# Patient Record
Sex: Female | Born: 1965 | Race: White | Hispanic: No | Marital: Married | Smoking: Current every day smoker
Health system: Southern US, Community
[De-identification: ages and names within clinical notes are randomized; demographics above are authoritative.]

## PROBLEM LIST (undated history)

## (undated) DIAGNOSIS — M4800 Spinal stenosis, site unspecified: Secondary | ICD-10-CM

## (undated) DIAGNOSIS — M1990 Unspecified osteoarthritis, unspecified site: Secondary | ICD-10-CM

## (undated) DIAGNOSIS — M069 Rheumatoid arthritis, unspecified: Secondary | ICD-10-CM

## (undated) DIAGNOSIS — R011 Cardiac murmur, unspecified: Secondary | ICD-10-CM

## (undated) DIAGNOSIS — M5030 Other cervical disc degeneration, unspecified cervical region: Secondary | ICD-10-CM

## (undated) HISTORY — PX: ABDOMINAL SURGERY: SHX537

## (undated) HISTORY — PX: BACK SURGERY: SHX140

## (undated) HISTORY — PX: TONSILLECTOMY: SUR1361

## (undated) HISTORY — PX: LAPAROSCOPIC SALPINGO OOPHERECTOMY: SHX5927

## (undated) HISTORY — DX: Other cervical disc degeneration, unspecified cervical region: M50.30

## (undated) HISTORY — DX: Spinal stenosis, site unspecified: M48.00

---

## 1999-07-02 ENCOUNTER — Encounter: Payer: Self-pay | Admitting: Neurosurgery

## 1999-07-04 ENCOUNTER — Observation Stay (HOSPITAL_COMMUNITY): Admission: RE | Admit: 1999-07-04 | Discharge: 1999-07-05 | Payer: Self-pay | Admitting: Neurosurgery

## 1999-07-04 ENCOUNTER — Encounter: Payer: Self-pay | Admitting: Neurosurgery

## 1999-10-31 ENCOUNTER — Encounter: Admission: RE | Admit: 1999-10-31 | Discharge: 2000-01-29 | Payer: Self-pay | Admitting: Anesthesiology

## 2000-01-02 ENCOUNTER — Encounter: Payer: Self-pay | Admitting: Physical Medicine and Rehabilitation

## 2000-01-02 ENCOUNTER — Ambulatory Visit (HOSPITAL_COMMUNITY)
Admission: RE | Admit: 2000-01-02 | Discharge: 2000-01-02 | Payer: Self-pay | Admitting: Physical Medicine and Rehabilitation

## 2001-08-17 ENCOUNTER — Encounter: Payer: Self-pay | Admitting: Neurosurgery

## 2001-08-17 ENCOUNTER — Ambulatory Visit (HOSPITAL_COMMUNITY): Admission: RE | Admit: 2001-08-17 | Discharge: 2001-08-17 | Payer: Self-pay | Admitting: Neurosurgery

## 2001-09-15 ENCOUNTER — Encounter: Payer: Self-pay | Admitting: Neurosurgery

## 2001-09-19 ENCOUNTER — Inpatient Hospital Stay (HOSPITAL_COMMUNITY): Admission: RE | Admit: 2001-09-19 | Discharge: 2001-09-20 | Payer: Self-pay | Admitting: Neurosurgery

## 2001-09-19 ENCOUNTER — Encounter: Payer: Self-pay | Admitting: Neurosurgery

## 2001-10-18 ENCOUNTER — Encounter: Admission: RE | Admit: 2001-10-18 | Discharge: 2001-10-18 | Payer: Self-pay | Admitting: Neurosurgery

## 2001-10-18 ENCOUNTER — Encounter: Payer: Self-pay | Admitting: Neurosurgery

## 2001-12-20 ENCOUNTER — Encounter: Admission: RE | Admit: 2001-12-20 | Discharge: 2001-12-20 | Payer: Self-pay | Admitting: Neurosurgery

## 2001-12-20 ENCOUNTER — Encounter: Payer: Self-pay | Admitting: Neurosurgery

## 2002-01-23 ENCOUNTER — Encounter: Admission: RE | Admit: 2002-01-23 | Discharge: 2002-01-23 | Payer: Self-pay | Admitting: Neurosurgery

## 2002-01-23 ENCOUNTER — Encounter: Payer: Self-pay | Admitting: Neurosurgery

## 2002-06-26 ENCOUNTER — Inpatient Hospital Stay (HOSPITAL_COMMUNITY): Admission: AD | Admit: 2002-06-26 | Discharge: 2002-06-28 | Payer: Self-pay | Admitting: Family Medicine

## 2002-06-26 ENCOUNTER — Encounter: Payer: Self-pay | Admitting: Family Medicine

## 2002-06-26 ENCOUNTER — Encounter: Payer: Self-pay | Admitting: Cardiovascular Disease

## 2002-11-19 ENCOUNTER — Emergency Department (HOSPITAL_COMMUNITY): Admission: EM | Admit: 2002-11-19 | Discharge: 2002-11-19 | Payer: Self-pay | Admitting: *Deleted

## 2002-11-20 ENCOUNTER — Encounter: Payer: Self-pay | Admitting: *Deleted

## 2002-11-21 ENCOUNTER — Inpatient Hospital Stay (HOSPITAL_COMMUNITY): Admission: RE | Admit: 2002-11-21 | Discharge: 2002-11-23 | Payer: Self-pay | Admitting: *Deleted

## 2003-09-07 ENCOUNTER — Ambulatory Visit (HOSPITAL_COMMUNITY): Admission: RE | Admit: 2003-09-07 | Discharge: 2003-09-07 | Payer: Self-pay | Admitting: Internal Medicine

## 2004-01-10 ENCOUNTER — Ambulatory Visit (HOSPITAL_COMMUNITY): Admission: RE | Admit: 2004-01-10 | Discharge: 2004-01-10 | Payer: Self-pay | Admitting: Internal Medicine

## 2004-04-30 ENCOUNTER — Ambulatory Visit (HOSPITAL_COMMUNITY): Admission: RE | Admit: 2004-04-30 | Discharge: 2004-04-30 | Payer: Self-pay | Admitting: Internal Medicine

## 2004-05-02 ENCOUNTER — Ambulatory Visit (HOSPITAL_COMMUNITY): Admission: RE | Admit: 2004-05-02 | Discharge: 2004-05-02 | Payer: Self-pay | Admitting: Internal Medicine

## 2004-05-30 ENCOUNTER — Ambulatory Visit: Payer: Self-pay | Admitting: Family Medicine

## 2004-06-18 ENCOUNTER — Ambulatory Visit: Payer: Self-pay | Admitting: Pulmonary Disease

## 2008-10-24 ENCOUNTER — Emergency Department (HOSPITAL_COMMUNITY): Admission: EM | Admit: 2008-10-24 | Discharge: 2008-10-24 | Payer: Self-pay | Admitting: Emergency Medicine

## 2010-07-13 LAB — PREGNANCY, URINE: Preg Test, Ur: NEGATIVE

## 2010-07-13 LAB — URINALYSIS, ROUTINE W REFLEX MICROSCOPIC
Bilirubin Urine: NEGATIVE
Ketones, ur: NEGATIVE mg/dL
Nitrite: POSITIVE — AB
Protein, ur: 30 mg/dL — AB
Urobilinogen, UA: 2 mg/dL — ABNORMAL HIGH (ref 0.0–1.0)

## 2010-07-13 LAB — URINE MICROSCOPIC-ADD ON

## 2010-08-22 NOTE — Discharge Summary (Signed)
Deborah Hoffman, Deborah Hoffman                         ACCOUNT NO.:  [id number]   MEDICAL RECORD NO.:  [id number]                   PATIENT TYPE:  INP   LOCATION:  A416                                 FACILITY:  [facility]   PHYSICIAN:  Langley Gauss, M.D.                DATE OF BIRTH:  March 19, 1966   DATE OF ADMISSION:  11/20/2002  DATE OF DISCHARGE:  11/23/2002                                 DISCHARGE SUMMARY   The patient was initially seen in the ER as a GYN unassigned patient.  This  was on November 20, 2002.  The patient at that point in time was made  observation status following performance of the CT scan and ultrasound.  Subsequently, on November 21, 2002, the patient was admitted, and she was  taken to the operating room where laparoscopy with lysis of adhesions  bilaterally as well as right salpingo-oophorectomy were performed without  complications.  The patient subsequently required hospital care August 17,  November 22, 2002, and was discharged home on November 23, 2002.   DISCHARGE MEDICATIONS:  Tylox #30, with no refill.   LABORATORY STUDIES:  A positive blood type.  BUN and creatinine within  normal limits.  Initial laboratories:  Hemoglobin 15.5, hematocrit 45.2,  with a white count of 6.1, subsequent 13.6/40.2, and the p.m. of November 21, 2002, hemoglobin had equilibrated to 10.3.   Other laboratory studies:  A CT scan had been performed as appendicitis was  a consideration.  The CT scan showed an enlarged complex-appearing right  ovary with free fluid seen within the pelvis.  The overall size of the ovary  was 5.6 x 4.6 cm.  The enlarged ovary was described as having cystic areas,  thickened irregular walls, and surrounding infiltrative and inflammatory  changes.  The appendix was not visualized, but there were no findings which  would be suggestive of appendicitis.  Subsequently, for better visualization  of the ovary, a transvaginal ultrasound had been performed which again  revealed an irregular enlarged right ovary with an irregular hypoechoic area  in the right ovary measuring 2.2 cm in greatest diameter.  Impression was  probable hemorrhagic ovarian cyst which could have been managed expectantly.  However, the patient was having severe pain at that time, so there was  always a consideration of torsion occurring.   Thus, the patient was taken to the operating room.  The laparoscopy and  right salpingo-oophorectomy were performed without complications.  The  patient had had a previous tubal ligation.  Postoperatively the patient did  well.  She was treated with PCA Dilaudid.  She did well with this.  Foley  catheter was removed on postoperative day #1.  Thereafter she was also  changed to oral medications to include oral pain medications.  She remained  afebrile, advanced to a regular general diet such that she was ready for  discharge on November 23, 2002.  On examination the abdomen is noted to be  soft and nontender.  The incision site is noted to be well approximated, and  it seems to be healing well with minimal erythema and minimal induration.  Thus, ready for discharge on November 23, 2002.                                               Langley Gauss, M.D.    DC/MEDQ  D:  11/30/2002  T:  11/30/2002  Job:  [id number]

## 2010-08-22 NOTE — Discharge Summary (Signed)
   Deborah Hoffman, Deborah Hoffman                         ACCOUNT NO.:  [id number]   MEDICAL RECORD NO.:  [id number]                   PATIENT TYPE:  INP   LOCATION:  A223                                 FACILITY:  [facility]   PHYSICIAN:  Kirk Ruths, M.D.            DATE OF BIRTH:  Oct 06, 1965   DATE OF ADMISSION:  06/26/2002  DATE OF DISCHARGE:  06/28/2002                                 DISCHARGE SUMMARY   DISCHARGE DIAGNOSES:  1. Chest pain.  2. Chronic back pain.  3. Tobacco use.   HOSPITAL COURSE:  This 45 year old white female was admitted to the office  complaining of chest pain which is described as a cinder block sitting on  her chest.  The pain had recent occurred two days before, relieved with some  rest and Valium with recurrence on the day of admission.  She also had  tingling in her right arm, nausea, sweats, dizziness and palpitations.  Due  to the patient's father dying at 51 of a myocardial infarctions and her  symptoms, she is admitted for rule out MI.  The patient was admitted and  monitored on  telemetry.  She was seen in consultation by [facility].  The patient's chest x-ray was normal.  Serial cardiac enzymes  were normal.  Blood gases were 7.42, PCO2 42 and PO2 of 73 which was  somewhat low for somewhat of her age, although, she does smoke.  White count  was 5900 with hemoglobin of 14.6.  D. dimer was slightly up at 0.51 with  0.48 being upper limits of normal.  Electrolytes were normal except for  potassium of 3.4.  Urinalysis was unremarkable.  RPR was nonreactive.  The  patient also underwent echocardiogram which was perfectly normal.  She  underwent a CT scan of her chest which ruled out pulmonary embolus.  There  was some mild emphysema and reactive airways consistent with bronchitis.  The patient's blood pressure has been running low since she was admitted,  although, she was on a nitroglycerin drip initially.  The patient's EKG was  normal.   ASSESSMENT:  Chest pain resolved, myocardial infarction ruled out.   PLAN:  Will discharge the patient home on previous medications which are  basically muscle relaxants and pain medications for her back.  She will  start taking one enteric coated baby aspirin daily.  Will be followed by  [facility] at a later date for stress Cardiolite.  Stable at  the time of discharge.                                               Kirk Ruths, M.D.    WMM/MEDQ  D:  06/28/2002  T:  06/29/2002  Job:  [id number]

## 2010-08-22 NOTE — Op Note (Signed)
[place]  Patient:    Deborah Hoffman, Deborah Hoffman Visit Number: [id number] MRN: [id number]          Service Type: SUR Location: [facility] 3172 02 Attending Physician:  Donn Pierini Dictated by:   Julio Sicks, M.D. Proc. Date: 09/19/01 Admit Date:  09/19/2001                             Operative Report  PREOPERATIVE DIAGNOSIS:  L4-5 degenerative disk disease with intractable back pain/failed back syndrome.  POSTOPERATIVE DIAGNOSIS:  L4-5 degenerative disk disease with intractable back pain/failed back syndrome.  OPERATION PERFORMED:  Re-exploration of L4-5 laminotomy with complete L4-5 decompressive laminectomy and bilateral L4-5 microdiskectomies.  L4-5 posterior lumbar interbody fusion utilizing tangent wedges and local autograft.  L4-5 posterolateral fusion utilizing pedicle screw instrumentation and local autograft.  SURGEON:  Julio Sicks, M.D.  ASSISTANT:  Reinaldo Meeker, M.D.  ANESTHESIA:  General endotracheal.  INDICATIONS FOR PROCEDURE:  The patient is a 45 year old female who is status post previous right-sided L4-5 laminotomy and microdiskectomy.  The patient had persistent pain postoperatively.  She underwent re-exploration for her laminotomy by another physician at an outside hospital without improvement of her symptoms.  The patient presents now with continued severe back and right lower extremity pain failing all conservative management.  MRI scan demonstrates severe degenerative disk disease at the L4-5 level with evidence of type 2 Modic changes within the vertebral end plates.  The remainder of the lumbar spine although showing some mild degeneration does not show any significant structural issues.  We discussed options available for management including the possibility of undergoing an L4-5 decompression and fusion surgery in hopes of alleviating some of her symptoms.  The patient is aware of the risks and benefits and wished to  proceed.  DESCRIPTION OF PROCEDURE:  The patient was taken to the operating room and placed on the operating table in supine position.  After an adequate level of anesthesia was achieved, the patient was positioned prone onto a Wilson frame and appropriately padded.  The patients lumbar region was shaved and prepped sterilely.  A 10 blade was used to make a linear skin incision overlying the L3, L4 and L5 levels.  This was carried down sharply in the midline. Subperiosteal dissection was performed on the left side exposing the lamina and facet joints at L4, L3 and L5 as well as the transverse processes of L4 and L5.  The deep self-retaining retractor was placed.  Intraoperative fluoroscopy was used and the levels were confirmed.  The patients previous laminotomy on the right side at L4-5 was dissected free using dental instruments and the electrocautery.   A complete laminectomy at L4 was then performed using Kerrison rongeurs, Leksell rongeurs, and a high speed drill. All elements of the lamina were resected.  A complete inferior facetectomy at L4 was then performed bilaterally.  A superior facetectomy of L5 was then performed bilaterally.  All bone was cleaned and used for later autograft. The ligamentum flavum was then elevated and resected in piecemeal fashion using Kerrison rongeurs.  Epidural scar was dissected free using dental instruments.  A very small partial thickness dural laceration was encountered on the right side.  This was oversewn with 5-0 Prolene in a figure-of-eight fashion.  The epidural venous plexus was coagulated and cut.  Starting first, the patients left side thecal sac and nerve roots were protected.  Disk space isolated, incised with  a 15 blade in rectangular fashion.  A wide disk space clean-out was then achieved using pituitary rongeurs, upward angled pituitary rongeurs and Epstein curets.  The procedure was then repeated on the patients right side.  At this  time epidural scar was dissected free and the thecal sac and L5 nerve root were mobilized and retracted toward the midline.  The disk space was incised with the 15 blade in rectangular fashion.  A wide disk space clean-out was then achieved on the right side.  The disk space was then sequentially dilated up to 10 mm.  With a 10 mm distractor left in the patients left side and the nerve roots protected on the patients right side, the disk space was then reamed and then cut with a 10 mm tangent chisel.  All soft tissue was  removed from the interspace.  A 10 x 24 mm tangent wedge was then impacted into placed and recessed approximately 2 mm from the posterior cortical surface.  The distractor was removed from the contralateral side. The nerve root and thecal sac were then protected on the the patients left side.  Once again, the disk space was then reamed and then cut with a 10 mm chisel.  Prior to installation of the second wedge, morselized autograft was packed in the interspace.  A second 10 x 24 mm tangent wedge was then impacted into place and recessed approximately from the posterior cortical margin.  The pedicles at L4 and L5 were then isolated using surface landmarks and intraoperative fluoroscopy.  An entry point was then made using a high speed drill.  The pedicles were then probed using a pedicle awl.  Each pedicle awl tract was found to be solidly within bone by using a blunt probe.  Each pedicle awl tract was then tapped with a 5.25 mm screw tap.  Each screw tap hole was found to be solidly within bone.  At L4 and L5 bilaterally 6.75 x 40 mm spiral 90 screws were then placed bilaterally.  These were given a tightening and found to be solidly within bone.  The transverse processes at L4 and L5 were then decorticated bilaterally using electrocautery.  Morselized autograft was packed posterolaterally.  A short segment of titanium rod was then contoured and placed over the screw heads  at L4 and L5.  Locking caps were then placed on the screw heads and then these were secured in a sequential fashion while placing the construct under compression.  Blunt probe  was passed easily along the exiting L4 and L5 nerve roots bilaterally.  There is no evidence of injury to the nerve roots bilaterally.  There is no evidence of CSF leak.  After the wound was irrigated one final time, Tisseal fibrin glue sealant was placed over the dural repair on the patients right side. Gelfoam was placed over the laminectomy defect for hemostasis which was found to be good.  The wound was then closed in layers with Vicryl sutures. Steri-Strips and sterile dressing were applied.  There were no apparent complications.  The patient tolerated the procedure well and she returned to the recovery room postoperatively. Dictated by:   Julio Sicks, M.D. Attending Physician:  Donn Pierini DD:  09/19/01 TD:  09/19/01 Job: [id number]

## 2010-08-22 NOTE — Procedures (Signed)
[facility]  Patient:    Deborah Hoffman, Deborah Hoffman                        MRN: [id number] Proc. Date: 10/31/99 Adm. Date:  [id number] Attending:  Thyra Breed CC:         Lillia Dallas. Murray Hodgkins, M.D.                           Procedure Report  PROCEDURE:  Lumbar epidural steroid injection.  DIAGNOSIS:  Lumbar radiculopathy with MRI showing epidural scarring at L4-5 to the right. She also has some degenerative disk disease at L4-5 and L3-4.  HISTORY OF PRESENT ILLNESS:  The patient gives a history of a work related injury on June 11, 1999 which necessitated surgical intervention by Dr. Coletta Memos on March 30. Postoperative pain levels did not go down significantly. She now has a hot spot in her right buttock and right hip region which she described as a continuous throb. She feels as though her entire right lower extremity is on fire. She notes that she has numbness through the entire extremity and has a tendency to drag her foot. She denied any loss of control of her bowels or bladder but has noted some decreased emptying of her bladder. This is more correlated with the opiates she has been taking. She notes that her back is improved by laying flat with 2 pillows under her leg but she only gets about 2 hours per night. She has been recently started on Pamelor for this. She is also taking Vicodin and notes that she gets some improvement with 2 tablets q. 4h but apparently is taking this far in excess of what it was prescribed at. She says that her pain is made worse by bending, squatting, or lifting.  CURRENT MEDICATIONS:  Vicodin and Pamelor.  ALLERGIES:  PENICILLIN and ANTIHISTAMINES as well as AMBIEN.  FAMILY HISTORY:  Positive for coronary artery disease, COPD and osteoarthritis.  PAST SURGICAL HISTORY:  Significant for the back surgery on July 04, 1999, revision of intra-abdominal scars at the age of 69, and tonsillectomy as a child.  SOCIAL HISTORY:  The  patient is a pack per day smoker. She does not drink alcohol. 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  ACTIVE MEDICAL PROBLEMS:  History of chronic bronchitis, history of heart murmur, and fractures to her left ankle x 2 as well as remote history of depression 12 years ago which required hospitalization.  REVIEW OF SYSTEMS:  GENERAL:  The patient does get night sweats. EYES: Negative. NOSE/MOUTH/THROAT:  Negative. HEAD:  Negative.  EARS: Negative. PULMONARY: Negative except for chronic bronchitis and cigarette smoking. HEART: Significant murmur. GU:  Negative although she does have difficulty voiding which she mentioned previously. GI:  Negative. MUSCULOSKELETAL/NEUROLOGIC:  See HPI. HEMATOLOGIC:  Negative. ENDOCRINE: Negative. CUTANEOUS:  Negative. PSYCHIATRIC:  History of depression. ALLERGY/IMMUNOLOGIC:  Negative.  PHYSICAL EXAMINATION:  VITAL SIGNS:  Blood pressure 112/65, heart rate 97, respiratory rate 16, O2 saturations 99%, pain level 8/10 and temperature is 98.6.  GENERAL:  This is a pleasant, anxious female in no acute distress.  HEENT:  Head was normocephalic, atraumatic. Eyes, extraocular movements intact. Conjunctivae and sclerae clear. Nose patent nares without discharge. Oropharynx was free of lesions.  NECK:  Supple without lymphadenopathy.  LUNGS:  Clear.  HEART:  Regular rate and rhythm with grade 1/6 systolic ejection murmur along the  left sternal border.  BREASTS/ABDOMINAL/PELVIC/RECTAL:  Not performed.  BACK:  Revealed well healed surgical scar with tenderness over the scar. Straight leg raise were positive on the right side at 60 degrees.  EXTREMITIES:  No cyanosis, clubbing or edema. Radial pulses and dorsalis pedis pulse 2+ and symmetric.  NEUROLOGIC:  The patient was oriented x 4. Cranial nerves II-XII are grossly intact. Deep tendon reflexes were 0-1+ in the upper extremities and lower extremities and symmetric with  downgoing toes. Motor was 5/5 with symmetric bulk and tone. Sensory demonstrated global attenuated sensation to pinprick vibratory sense in the right lower extremity in a nondermatomal distribution.  MRI of her back from Aug 12, 1999 demonstrated right central disk protrusion at 4-5 with scar tissue and mild disk space narrowing at 4-5 and 3-4. There was some scarring about the right L5 nerve root.  IMPRESSION: 1. Low back pain with radiation out into the right lower extremity but in a a    nondermatomal distribution with MRI demonstrating degenerative disk    disease, and scarring around the L5 nerve root. 2. Chronic bronchitis by history. 3. Heart murmur. 4. History of fractures to her left ankle. 5. Remote history of depression.  DISPOSITION:  I discussed the potential risks, benefits and limitations of a lumbar epidural steroid injection in detail with the patient. She is interested in pursuing this.  DESCRIPTION OF PROCEDURE:  After informed consent was obtained, the patient was placed in the sitting position and monitored. The patients back was prepped with Betadine x 3. A skin wheal was raised at the L3-4 interspace with 1 percent lidocaine. A 20 gauge Tuohy needle was introduced to the lumbar epidural space to loss of resistance to preservative free normal saline. There was no cerebrospinal fluid nor blood. 80 mg of Medrol and 8 ml of preservative free normal saline was gently injected. The needle was flushed with preservative free normal saline and removed intact.  CONDITION POST PROCEDURE:  Stable.  DISCHARGE INSTRUCTIONS:  Resume previous diet. Limitations in activities per instruction sheet. Resume previous medications. The patient plans the see Dr. Murray Hodgkins back in follow-up next week. DD:  10/31/99 TD:  11/03/99 Job: [id number]

## 2010-08-22 NOTE — H&P (Signed)
. [facility]  Patient:    Deborah Hoffman, Deborah Hoffman                        MRN: [id number] Adm. Date:  [id number] Attending:  Coletta Memos                         History and Physical  ADMITTING DIAGNOSES:  1. Displaced disk, L4-5, right.  2. Degenerative disk disease, L4-5.  HISTORY OF PRESENT ILLNESS: Deborah Hoffman is a 45 year old woman who initially presented to me on January 30, 1999.  At that time she was having back and bilateral lower extremity pain.  She works at [employer] where she does have to do a lot of lifting.  On June 11, 1999 she lifted a box about 80 pounds in weight and then had a searing pain going into the right lower extremity.  The pain was  similar to what she had in the past but much more severe.  An MRI which was done on June 23, 1999 showed changes in the lumbar spine and she is coming back for review.  REVIEW OF SYSTEMS: Positive for sinus problems, heart murmur, leg pain with walking, bronchitis.  She has a history of a fractured left ankle, collar bone, and tail bone.  She reports arm weakness, leg weakness, back pain, leg pain, joint pain, arthritis, neck pain, breast pain, problems with coordination.  She denies constitutional, eye, ear, nose, throat, mouth, gastrointestinal or genitourinary, psychiatric, endocrine, hematologic, or allergic problems.  She has pain going p between her shoulder blades.  PAST SURGICAL HISTORY:  1. Tonsillectomy in 1970.  2. Female surgery in 1980 by her account.  ALLERGIES: PENICILLIN, HYDROBROMIDE OR HYDROCHLORIDE, and NYQUIL makes her hallucinate.  SOCIAL HISTORY: She smokes a pack of cigarettes a day since 1978.  She drinks alcohol infrequently.  She has no history of illicit drug use.  FAMILY HISTORY: Mother age 49 is in fair health.  Father died at age 48 and had a myocardial infarction.  Diabetes and hypertension are present in the family.  PHYSICAL EXAMINATION:  VITAL  SIGNS:  Height 5 feet 1-1/2 inches.  Weight 145 pounds.  NEUROLOGIC: She has 5/5 strength in the lower extremities.  Positive straight leg raising on the right side.  Muscle tone, bulk, and coordination are normal. She has decreased pinprick throughout the lower extremities.  Downgoing toes on plantar stimulation.  There is no clonus.  Cranial nerves 2-12 normal.  NECK: No cervical masses or bruits.  HEART: No heart murmur on examination.  CHEST:  Lung fields are clear.  LABORATORY DATA: MRI done June 23, 1999 shows a herniated disk at L4-5 compressing the L5 nerve root on the right side.  Disk is degenerated.  DIAGNOSES:  1. Displaced disk, L4-5, right.  2. Degenerative disk disease, L405.  PLAN:  Ms. Deborah Hoffman is having a significant amount of pain in the right lower extremity.  We discussed steroid therapy and she feels that she just wants the ain taken care of as fast as possible.  For that reason we will simply go ahead and  schedule surgery, which will be laminectomy and diskectomy at L4-5 on the right  side.  The risks of the procedure including bleeding, infection, no pain relief, bowel or bladder dysfunction, recurrent disk herniation, need for further surgery were discussed.  She understands and wishes to proceed.DD:  07/04/99 TD:  07/04/99 Job: [id number]  [id number]

## 2010-08-22 NOTE — H&P (Signed)
Deborah Hoffman, Deborah Hoffman                         ACCOUNT NO.:  [id number]   MEDICAL RECORD NO.:  [id number]                   PATIENT TYPE:  OBV   LOCATION:  A416                                 FACILITY:  [facility]   PHYSICIAN:  Langley Gauss, M.D.                DATE OF BIRTH:  02/13/1966   DATE OF ADMISSION:  11/20/2002  DATE OF DISCHARGE:                                HISTORY & PHYSICAL   HISTORY OF PRESENT ILLNESS:  The patient is a 45 year old multigravida with  a last menstrual period about three weeks previously who presents to the  emergency room on a.m. of November 20, 2002, for which I am contacted for  complete management as an unassigned patient.  Interestingly enough, the  patient had been seen in the emergency room on November 19, 2002, with the  same exact pain which is unchanged.  On November 19, 2002, with this right  lower quadrant pain, she was diagnosed with bacteriuria and bacterial  vaginosis.  She was discharged from the emergency room with instructions to  follow up the a.m. of November 20, 2002, for a transvaginal ultrasound.  The  transvaginal ultrasound was performed in the Department of Radiology on  November 20, 2002, at which time, due to the pain she was experiencing, she  was sent back to the emergency room by the radiology staff.  This is now  Monday.  Unassigned call roster house now changed, and for that reason, I am  contacted for complete management, even though the patient initially  presented on November 19, 2002, with the same complaints.  The patient's  primary complaint is that of a several week history of diffuse abdominal  pain with now some localization to the right lower quadrant over the past  five days.  She also describes a poor appetite.  She does have some hot and  cold sweats, although she does not have a thermometer to take her  temperature.  She states the pain was most severe about 24 hours previously,  at which time the right lower quadrant  pain became acutely severe and sharp  in nature which prompted her visitation to the emergency room on November 19, 2002, at which time absolutely nothing was resolved.  Rather, the case was  just held over to be managed on November 20, 2002.  The patient's last  menstrual period was three weeks previously.   REVIEW OF SYSTEMS:  Pertinent for no prior GI complaints.  No prior history  of IBS, diarrhea, constipation or colitis.  Menstrual periods are regular  and predictable.  No significant dysmenorrhea.  No significant clots.   ALLERGIES:  PENICILLIN, AMBIEN, NEURONTIN, and ANTIHISTAMINES.   MEDICATIONS:  1. OxyContin 40 mg p.o. b.i.d..  2. Vicodin 7.5/650 p.r.n.  3. Valium 5 mg p.o. t.i.d.   PAST MEDICAL HISTORY:  Pertinent for three prior back surgeries with  resultant chronic back  pain at present which is the indication for both her  disability as well as her chronic use of OxyContin.  Past history is  otherwise pertinent only for vaginal deliveries.   PHYSICAL EXAMINATION:  GENERAL APPEARANCE:  The patient is obviously in  moderate distress, although this is completely unchanged from the patient's  presentation on November 19, 2002, per patient statement.  She does state that  when she left the emergency room in the p.m. of November 19, 2002, she was  hardly able to walk due to the pain.  VITAL SIGNS:  Temperature 97.7, pulse 86, respiratory rate 24, blood  pressure 121/75.  HEENT:  Negative.  No adenopathy.  NECK:  Supple.  Thyroid is nonpalpable.  LUNGS:  Clear.  CARDIOVASCULAR:  Regular rate and rhythm.  ABDOMEN:  Soft, nontender with the exception of right lower quadrant pain  particularly in the right adnexal region as well as midway between the  umbilicus and suprapubic area.  No guarding or rebound is identified, but  moderate tenderness is elicited with deep palpation.  The abdomen itself is  nondistended.  Bowel sounds in all quadrants.  No pelvic or abdominal masses  are  appreciated.  EXTREMITIES:  Noted to be normal.  GU:  External genitalia within normal limits.  PELVIC:  Not performed.  This has been performed by Dr. Rosalia Hammers in the emergency  room at which time she states she also did GC/Chlamydia cultures.   PERTINENT LABORATORY DATA:  Negative beta HCG.  Hemoglobin 15.4, hematocrit  44.8 with a white count of 9.4.  Urinalysis is completely negative.  Chem-12  is within normal limits.  Electrolytes within normal limits.  Liver function  tests within normal limits.  Urinalysis is pertinent only for a hemorrhagic  cyst.  Prominent uterine size identified.  There is not an enlarged right  ovary but what appears to be a hemorrhagic cyst.   Due to the patient's symptoms, after I evaluated the patient myself on  November 20, 2002, the History and Physical findings were suggestive of  possible appendicitis; thus, CT scan with contrast was ordered by myself.  Results reveal the appendix is not visualized.  The right ovary is noted to  be more significant than on ultrasound with a 5.6 x 4.6 cm complex cyst with  thick irregular wall and surrounding inflammatory versus infiltrative  changes.  Impression of the radiologist was probable hemorrhagic ovarian  cyst with right ovarian torsion a possibility.   The patient is evaluated throughout the day on November 20, 2002.  She is  noted to have no change in her pain.  The pain persists and is managed only  with the IV Dilaudid.   PLAN:  With this significant lesion noted on the right adnexal region by CT  scan as well as the pain the patient is experiencing, she will be added on  the schedule on November 21, 2002, for a laparoscopy at which time we will  proceed with management of the adnexal mass which will consist of removal of  right tube and ovary.                                               Langley Gauss, M.D.    DC/MEDQ  D:  11/21/2002  T:  11/21/2002  Job:  [id number]

## 2010-08-22 NOTE — Op Note (Signed)
[facility]  Patient:    Deborah Hoffman, Deborah Hoffman                        MRN: [id number] Proc. Date: 07/04/99 Adm. Date:  [id number] Attending:  Coletta Memos                           Operative Report  ADMISSION DIAGNOSES: 1. Displaced disk at L4-5, right. 2. Degenerative disk disease at L4-5. 3. Right L5 radiculopathy.  POSTOPERATIVE DIAGNOSES: 1. Displaced disk at L4-5, right. 2. Degenerative disk disease at L4-5. 3. Right L5 radiculopathy.  PROCEDURES:  Right L4-5 semi-hemilaminectomy and diskectomy with microdissection.  COMPLICATIONS:  None.  SURGEON:  Kyle L. Franky Macho, M.D.  ASSISTANT:  Payton Doughty, M.D.  INDICATIONS:  Deborah Hoffman is a 45 year old woman with right lower extremity pain, which has not responded to conservative measures and has been unremitting.  She has a displaced disk at L4-5 on the right side.  I have recommended, and she has agreed to undergo a right L4-5 semi-hemilaminectomy and diskectomy.  OPERATIVE NOTE:  Deborah Hoffman was brought to the operating room.  She was intubated and placed under general anesthesia without difficulty.  She was rolled prone into a Wilson frame, and all pressure points were properly padded.  The skin was prepped with DuraPrep.  She was draped in sterile fashion.  I took a preoperative, localizing x-ray and used that as my guide.  I infiltrated my proposed incision  site with 0.5% lidocaine 1:200,000 strength epinephrine sterilely.  I then opened the incision with a #10 blade and took this down to the thoracolumbar fascia sharply.  I controlled bleeding in the subcutaneous tissues with bipolar cautery. I created a fascial flap in a semicircular fashion with a #10 blade and then Metzenbaum scissors.  I retracted that flap medially towards the left side.  I hen exposed the lamina of L4 and L5.  I took another x-ray with the ______ knife placed inferior to the lamina of L4.  X-ray confirmed  my position.  I then proceeded to perform a semi-hemilaminectomy at L4 using a Midas Rex drill and microdissection.  After exposing the thecal sac by removing the ligamentum flavum, I retracted that medially.  There were a significant amount of epidural blood vessels, which I was able to coagulate with bipolar cautery.  I retracted the thecal sac again medially and exposed the disk herniation.  I opened this was a  #15 blade and then proceeded to do a diskectomy with along with Dr. Channing Mutters using pituitary rongeurs, Epstein curettes, straight curettes, and nerve hooks.  I emptied the disk space until I felt that there was no more pressure on the L5 nerve root.  I irrigated the wound, and inspected the nerve root medially, laterally,  ______ , and caudally, and felt no resistance.  I then irrigated again.  I reapproximated the fascia with 5-0 Vicryl sutures.  I reapproximated the subcutaneous tissue with Vicryl sutures.  I reapproximated the skin edges with Steri-Strips.  A sterile dressing was applied.  The patient was rolled off of the Wilson frame, extubated, and moving all extremities postoperatively. DD:  07/04/99 TD:  07/05/99 Job: [id number]

## 2010-08-22 NOTE — Op Note (Signed)
Deborah Hoffman, HARPENAU                         ACCOUNT NO.:  [id number]   MEDICAL RECORD NO.:  [id number]                   PATIENT TYPE:  INP   LOCATION:  A416                                 FACILITY:  [facility]   PHYSICIAN:  Langley Gauss, M.D.                DATE OF BIRTH:  1966-03-07   DATE OF PROCEDURE:  DATE OF DISCHARGE:                                 OPERATIVE REPORT   PREOPERATIVE DIAGNOSES:  1. Pelvic pain.  2. Right complex ovarian cyst 8 cm in diameter.   POSTOPERATIVE DIAGNOSES:  1. Pelvic pain.  2. Right complex ovarian cyst 8 cm in diameter.  3. 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  The uterus itself is noted     to be diffusely enlarged with exterior contour, very irregular consistent     with multiple leiomyomas the largest noted to be estimated at 4 cm in     diameter in the posterior fundal portion of the uterus.   PROCEDURE:  1. Laparoscopy.  2. Lysis of adhesions bilaterally.  3. Right salpingo-oophorectomy.   SURGEON:  Dr. Roylene Reason. Lisette Grinder   ESTIMATED BLOOD LOSS:  Less than 100 cc.   ANESTHESIA:  General endotracheal   DRAINS:  Foley catheter left to straight drainage following the procedure.   OPERATIVE FINDINGS:  The patient is taken to the operating room and vital  signs are stable. The patient underwent uncomplicated induction of general  endotracheal anesthesia.  Pertinently she was positioned in the low  lithotomy position in the awake state, such that it could be very sensitive  to her chronic low back pain and avoid any excessive flexion at the hips.  A  Foley catheter is then placed to straight drainage.  Speculum examination is  performed.  The cervix is visualized.  The anterior lip is grasped with a  single-tooth tenaculum. A Hulka intrauterine manipulator was then passed  through the endocervical os and used to grab the cervix at 12 o'clock.   The patient's  abdominal area had been sterilely prepped.  I then changed to  sterile gloves. Standing at the patient's left side a 1 cm vertical incision  was made just inferior to the umbilicus. In addition a 1 cm suprapubic  horizontal incision was made.   The abdominal wall was then elevated. The Veress needle was then passed  through the subumbilical incision angling towards the hollow of the sacrum.  This was done atraumatically.  A drop test confirms a proper intraperitoneal  placement.  A pneumoperitoneum is then created by insufflating 3 liters of  CO2 gas at a filling pressure of less than 15 mmHg.  The Veress needle was  then removed.  The abdominal wall is again elevated.  The Vis-A-Port  disposable trocar is then utilized with camera connected, such that, under  direct visualization  the peritoneal cavity is atraumatically, sharply  entered without difficulty.  A 5 mm suprapubic trocar is then inserted under  direct visualization.  The pelvic contents are as previously described.   A third, 12-mm trocar is placed to the left of the umbilicus under direct  visualization.  This then allows 2 grasping instruments to be placed through  these incision sites which allows unipolar endoshears to be utilized to  sharply dissect the right complex ovary and distal fallopian tube from the  anterior abdominal wall.  The pedicles are then examined, specifically the  utero-ovarian as well as the infundibulopelvic ligament.  With the ovarian  mass elevated, a total of 2 firings of the 12-mm EndoGIA are then required.  These cross at right ankles.  One clamped across the infundibulopelvic  ligament, the other across the utero-ovarian ligament.  This completely  frees up the mass.   The EndoCatch sac is then passed down through the 12-mm trocar.  The cyst  complex mass is placed within the EndoCatch and removed intact; passed off  for permanent specimen.  The trocar is then reinserted through this left   lateral incision.  Under direct visualization, then the adhesive disease on  the left adnexa is then managed, dissected sharply off the anterior  abdominal wall utilizing sharp dissection with the unipolar endoshears. The  left ovary is noted to be normal in appearance with multiple small cysts  identified.   The 2 sites, then, are completely free of any adhesions. Irrigation is  performed.  A small amount of cautery is required utilizing the J-hook  cautery along our staple lines to assure hemostasis.  Irrigation is then  performed until clear.  A single piece of Surgicel cloth is then placed over  our staple lines in the right adnexa.  Our instruments are then removed.  Gas is allowed to escape.  Our incision sites are closed utilizing, on the  left lateral 12-mm sites, specifically 2-0 Vicryl in a continuous running  fashion to reapproximate the fascia and associated underlying peritoneum.   Excellent hemostasis is achieved.  A single 2-0 Vicryl suture is then placed  in the subcutaneous fat to function as subcutaneous tissue to close dead  space.  Likewise 2-0 Vicryl sutures are placed through the subumbilical  incision to close up the dead space.  The 3 incision sites are then  completely closed utilizing skin staples and 20 cc of 0.5% bupivacaine plain  injected along the skin incisions to facilitate postoperative analgesia.   The procedure is then completed. Dressings are applied. Foley catheter is  left in place and continues to drain clear, yellow urine.  Hulka manipulator  is removed from the cervix.  The patient is then reversed of anesthesia,  taken to the recovery room in stable condition; at which time the operative  findings discussed with the patient's awaiting family.  She will return to  the fourth floor after appropriate recovery period.                                               Langley Gauss, M.D.    DC/MEDQ  D:  11/21/2002  T:  11/22/2002  Job:  [id number]

## 2010-08-22 NOTE — H&P (Signed)
   Deborah Hoffman, Deborah Hoffman                         ACCOUNT NO.:  [id number]   MEDICAL RECORD NO.:  [id number]                   PATIENT TYPE:   LOCATION:  ICU3                                 FACILITY:   PHYSICIAN:  Kirk Ruths, M.D.            DATE OF BIRTH:  Jun 19, 1965   DATE OF ADMISSION:  DATE OF DISCHARGE:                                HISTORY & PHYSICAL   CHIEF COMPLAINT:  Chest pain.   PRESENTING ILLNESS:  This is a 45 year old white female who slipped in the  bathtub two days before this admission and bruised her leg, later that day  began having chest pain which was described as a pressure.  The pain  improved with rest and Valium, today has reoccurred.  She describes the pain  as an 8, states it feels like a cinder block sitting on her chest.  She has  some tingling in her right arm with some nausea, sweatiness, dizziness, and  palpitations.  EKG in the office was essentially normal although there was  some poor R wave progression in the lateral precordial leads.  The patient's  father died of an MI at 98.  She is a smoker and is sedentary due to  disability from her back.  The pain was slightly relieved by some  nitroglycerin in the office.  She will be admitted for follow-up MI.   ALLERGIES:  PENICILLIN.  She also cannot take PAXIL, NEURONTIN, or  DURAGESIC.   CURRENT MEDICATIONS:  Vicodin, Valium, and OxyContin for chronic back pain.   PAST SURGICAL HISTORY:  The patient is status post diskectomy of the low  back as well as abdominal surgery approximately 20 years ago, sounds like  some adhesions relating to her fallopian tubes and colon.   REVIEW OF SYSTEMS:  Noncontributory.   PHYSICAL EXAMINATION:  GENERAL:  A well-developed white female, appears in  no distress.  VITAL SIGNS:  Blood pressure 128/78, pulse 78 and regular, respirations 20  and unlabored.  HEENT:  TMs normal.  Pupils equal to light and accommodation.  Oropharynx  benign.  NECK:  Supple  without JVD, bruit, or thyromegaly.  LUNGS:  Clear in all areas.  HEART:  Regular sinus rhythm without murmur, gallop, or rub.  ABDOMEN:  Soft and nontender.  CHEST:  The chest wall is mildly tender.  ABDOMEN:  Soft and nontender.  EXTREMITIES:  Without clubbing, cyanosis, or edema.  NEUROLOGIC:  Grossly intact.    ASSESSMENT:  1. Chest pain, rule out myocardial infarction.  2. Chronic back pain.                                               Kirk Ruths, M.D.    WMM/MEDQ  D:  06/26/2002  T:  06/26/2002  Job:  [id number]

## 2013-09-11 ENCOUNTER — Emergency Department (HOSPITAL_COMMUNITY)
Admission: EM | Admit: 2013-09-11 | Discharge: 2013-09-11 | Disposition: A | Discharge status: Home / Self Care | Payer: Self-pay | Attending: Emergency Medicine | Admitting: Emergency Medicine

## 2013-09-11 ENCOUNTER — Emergency Department (HOSPITAL_COMMUNITY): Payer: Self-pay

## 2013-09-11 ENCOUNTER — Encounter (HOSPITAL_COMMUNITY): Payer: Self-pay | Admitting: Emergency Medicine

## 2013-09-11 DIAGNOSIS — R1031 Right lower quadrant pain: Secondary | ICD-10-CM | POA: Insufficient documentation

## 2013-09-11 DIAGNOSIS — Z9089 Acquired absence of other organs: Secondary | ICD-10-CM | POA: Insufficient documentation

## 2013-09-11 DIAGNOSIS — Z9889 Other specified postprocedural states: Secondary | ICD-10-CM | POA: Insufficient documentation

## 2013-09-11 DIAGNOSIS — A499 Bacterial infection, unspecified: Secondary | ICD-10-CM | POA: Insufficient documentation

## 2013-09-11 DIAGNOSIS — R6883 Chills (without fever): Secondary | ICD-10-CM | POA: Insufficient documentation

## 2013-09-11 DIAGNOSIS — R110 Nausea: Secondary | ICD-10-CM | POA: Insufficient documentation

## 2013-09-11 DIAGNOSIS — N760 Acute vaginitis: Secondary | ICD-10-CM

## 2013-09-11 DIAGNOSIS — Z3202 Encounter for pregnancy test, result negative: Secondary | ICD-10-CM | POA: Insufficient documentation

## 2013-09-11 DIAGNOSIS — Z880 Allergy status to penicillin: Secondary | ICD-10-CM | POA: Insufficient documentation

## 2013-09-11 DIAGNOSIS — B9689 Other specified bacterial agents as the cause of diseases classified elsewhere: Secondary | ICD-10-CM | POA: Insufficient documentation

## 2013-09-11 DIAGNOSIS — F17200 Nicotine dependence, unspecified, uncomplicated: Secondary | ICD-10-CM | POA: Insufficient documentation

## 2013-09-11 DIAGNOSIS — R109 Unspecified abdominal pain: Secondary | ICD-10-CM

## 2013-09-11 LAB — URINE MICROSCOPIC-ADD ON

## 2013-09-11 LAB — URINALYSIS, ROUTINE W REFLEX MICROSCOPIC
BILIRUBIN URINE: NEGATIVE
Glucose, UA: NEGATIVE mg/dL
HGB URINE DIPSTICK: NEGATIVE
Ketones, ur: NEGATIVE mg/dL
Nitrite: NEGATIVE
PROTEIN: NEGATIVE mg/dL
Specific Gravity, Urine: 1.015 (ref 1.005–1.030)
UROBILINOGEN UA: 0.2 mg/dL (ref 0.0–1.0)
pH: 6.5 (ref 5.0–8.0)

## 2013-09-11 LAB — POC URINE PREG, ED: Preg Test, Ur: NEGATIVE

## 2013-09-11 LAB — WET PREP, GENITAL
TRICH WET PREP: NONE SEEN
Yeast Wet Prep HPF POC: NONE SEEN

## 2013-09-11 MED ORDER — OXYCODONE-ACETAMINOPHEN 5-325 MG PO TABS: 1.0000 | ORAL_TABLET | ORAL | Status: DC | PRN

## 2013-09-11 MED ORDER — OXYCODONE-ACETAMINOPHEN 5-325 MG PO TABS
1.0000 | ORAL_TABLET | Freq: Once | ORAL | Status: AC
  Administered 2013-09-11: 1 via ORAL
  Filled 2013-09-11: qty 1

## 2013-09-11 MED ORDER — METRONIDAZOLE 500 MG PO TABS: 500.0000 mg | ORAL_TABLET | Freq: Two times a day (BID) | ORAL | Status: DC

## 2013-09-11 NOTE — ED Notes (Signed)
Pt c/o right side pelvic pain along with vaginal discharge x1-2 weeks. Pt states discharge is "dark red and brown".

## 2013-09-11 NOTE — ED Notes (Signed)
Pain lower abd,  "hot then cold".  Nausea,, no vomiting.  No diarrhea.  Dysuria, vag d/c, brown/red.

## 2013-09-11 NOTE — ED Notes (Signed)
Patient would like something for pain 

## 2013-09-11 NOTE — Care Management Note (Signed)
ED/CM noted patient did not have health insurance and/or PCP listed in the computer.  Patient was given the [place] resource handout with information on the clinics, food pantries, and the handout for new health insurance sign-up.  Patient expressed appreciation for information received. 

## 2013-09-11 NOTE — ED Provider Notes (Signed)
CSN: [id number]     Arrival date & time 09/11/13  1116 History   First MD Initiated Contact with Patient 09/11/13 1205     Chief Complaint  Patient presents with  . Abdominal Pain    Patient is a 48 y.o. female presenting with abdominal pain. The history is provided by the patient. No language interpreter was used.  Abdominal Pain Pain location:  RLQ Pain quality: sharp   Pain radiates to:  Does not radiate Pain severity:  Severe Onset quality:  Sudden Duration:  2 weeks Timing:  Constant Progression:  Worsening Chronicity:  New Worsened by:  Movement and position changes Associated symptoms: chills, dysuria, nausea and vaginal discharge   Associated symptoms: no vomiting    This chart was scribed for Joya Gaskins, MD by Andrew Au, ED Scribe. This patient was seen in room APA11/APA11 and the patient's care was started at 12:15 PM.  HPI Comments:  Deborah Hoffman is a 48 y.o. female who present to the Emergency Department complaining of right lower abdominal pain with associated chills, nausea and dysuria. She states during intercourse 2 weeks ago she had razor sharp pain. She states burning clear discharge 1 day ago and this morning a darker color. She denies emesis. She had left ovary removal in early 2000's.   History reviewed. No pertinent past medical history. Past Surgical History  Procedure Laterality Date  . Back surgery    . Abdominal surgery    . Laparoscopic salpingo oopherectomy     History reviewed. No pertinent family history. History  Substance Use Topics  . Smoking status: Current Every Day Smoker  . Smokeless tobacco: Not on file  . Alcohol Use: Yes   OB History   Grav Para Term Preterm Abortions TAB SAB Ect Mult Living                 Review of Systems  Constitutional: Positive for chills.  Gastrointestinal: Positive for nausea and abdominal pain. Negative for vomiting.  Genitourinary: Positive for dysuria, vaginal discharge and dyspareunia.   All other systems reviewed and are negative.  Allergies  Ambien; Neurontin; Other; Paxil; and Penicillins  Home Medications   Prior to Admission medications   Not on File   BP 143/77  Pulse 80  Temp(Src) 98 F (36.7 C) (Oral)  Resp 18  Ht 5\' 1"  (1.549 m)  Wt 145 lb (65.772 kg)  BMI 27.41 kg/m2  SpO2 98%  LMP 08/28/2013 Physical Exam CONSTITUTIONAL: Well developed/well nourished HEAD: Normocephalic/atraumatic EYES: EOMI/PERRL ENMT: Mucous membranes moist NECK: supple no meningeal signs SPINE:entire spine nontender CV: S1/S2 noted, no murmurs/rubs/gallops noted LUNGS: Lungs are clear to auscultation bilaterally, no apparent distress ABDOMEN: soft, nontender, no rebound or guarding GU:no cva tenderness No cmt.  Vaginal discharge noted.  +right adnexal tenderness noted, chaperone present NEURO: Pt is awake/alert, moves all extremitiesx4 EXTREMITIES: pulses normal, full ROM SKIN: warm, color normal PSYCH: no abnormalities of mood noted  ED Course  Procedures  12:20 PM- Pt advised of plan for treatment  and pt agrees.  1:43 PM Will proceed with 08/30/2013 imaging for pelvic pain 4:20 PM Pt improved abd soft on repeat eval Pt reported left ovary removed, but US shows normal left ovary and likely previous right ovary removal Pt stable for d/c home Will treat for BV   Labs Review Labs Reviewed  WET PREP, GENITAL - Abnormal; Notable for the following:    Clue Cells Wet Prep HPF POC MANY (*)  WBC, Wet Prep HPF POC MANY (*)    All other components within normal limits  URINALYSIS, ROUTINE W REFLEX MICROSCOPIC - Abnormal; Notable for the following:    Leukocytes, UA SMALL (*)    All other components within normal limits  URINE MICROSCOPIC-ADD ON - Abnormal; Notable for the following:    Squamous Epithelial / LPF MANY (*)    Bacteria, UA MANY (*)    All other components within normal limits  GC/CHLAMYDIA PROBE AMP  POC URINE PREG, ED   Results for orders placed during  the hospital encounter of 09/11/13  POC URINE PREG, ED      Result Value Ref Range   Preg Test, Ur NEGATIVE  NEGATIVE     Imaging Review US Transvaginal Non-ob  09/11/2013   CLINICAL DATA:  Right pelvic pain for 2 weeks. Patient reports the left ovary was previously removed. Gravida 4 para 3. LMP 08/16/2013.  EXAM: TRANSABDOMINAL AND TRANSVAGINAL ULTRASOUND OF PELVIS  DOPPLER ULTRASOUND OF OVARIES  TECHNIQUE: Both transabdominal and transvaginal ultrasound examinations of the pelvis were performed. Transabdominal technique was performed for global imaging of the pelvis including uterus, ovaries, adnexal regions, and pelvic cul-de-sac.  It was necessary to proceed with endovaginal exam following the transabdominal exam to visualize the ovaries and adnexal region. Color and duplex Doppler ultrasound was utilized to evaluate blood flow to the ovaries.  COMPARISON:  Report of prior CT of the abdomen and pelvis on 11/20/2002 and report of prior ultrasound of the pelvis on 11/20/2002  FINDINGS: Uterus  Measurements: 9.7 x 6.8 x 7.2 cm. No fibroids or other mass visualized.  Endometrium  Thickness: 14.6 mm.  No focal abnormality visualized.  Right ovary  Measurements: A normal right ovary is not seen.  Left ovary  Measurements: 3.8 x 2.6 x 3.9 cm, seen only on transabdominal portion of the exam. Small follicles are seen, largest 2.6 cm.  Pulsed Doppler evaluation of both ovaries demonstrates normal low-resistance arterial and venous waveforms. Note is made of prominent right pelvic veins.  Other findings  No free fluid.  IMPRESSION: 1. Normal appearance of the uterus and endometrium. 2. Normal appearing ovary in the left adnexal region despite the history of left oophorectomy. 3. Nonvisualized right ovary. 4. Prominent right pelvic veins possibly accounting for the patient's pain. No evidence for mass or other acute abnormality. 5. The findings were discussed with [name] on 09/11/2013 at 4:06 PM.    Electronically Signed   By: Rosalie Gums M.D.   On: 09/11/2013 16:07   US Pelvis Complete  09/11/2013   CLINICAL DATA:  Right pelvic pain for 2 weeks. Patient reports the left ovary was previously removed. Gravida 4 para 3. LMP 08/16/2013.  EXAM: TRANSABDOMINAL AND TRANSVAGINAL ULTRASOUND OF PELVIS  DOPPLER ULTRASOUND OF OVARIES  TECHNIQUE: Both transabdominal and transvaginal ultrasound examinations of the pelvis were performed. Transabdominal technique was performed for global imaging of the pelvis including uterus, ovaries, adnexal regions, and pelvic cul-de-sac.  It was necessary to proceed with endovaginal exam following the transabdominal exam to visualize the ovaries and adnexal region. Color and duplex Doppler ultrasound was utilized to evaluate blood flow to the ovaries.  COMPARISON:  Report of prior CT of the abdomen and pelvis on 11/20/2002 and report of prior ultrasound of the pelvis on 11/20/2002  FINDINGS: Uterus  Measurements: 9.7 x 6.8 x 7.2 cm. No fibroids or other mass visualized.  Endometrium  Thickness: 14.6 mm.  No focal abnormality visualized.  Right ovary  Measurements: A normal right ovary is not seen.  Left ovary  Measurements: 3.8 x 2.6 x 3.9 cm, seen only on transabdominal portion of the exam. Small follicles are seen, largest 2.6 cm.  Pulsed Doppler evaluation of both ovaries demonstrates normal low-resistance arterial and venous waveforms. Note is made of prominent right pelvic veins.  Other findings  No free fluid.  IMPRESSION: 1. Normal appearance of the uterus and endometrium. 2. Normal appearing ovary in the left adnexal region despite the history of left oophorectomy. 3. Nonvisualized right ovary. 4. Prominent right pelvic veins possibly accounting for the patient's pain. No evidence for mass or other acute abnormality. 5. The findings were discussed with [name] on 09/11/2013 at 4:06 PM.   Electronically Signed   By: Rosalie Gums M.D.   On: 09/11/2013 16:07   [place] Art/ven  Flow Abd Pelv Doppler  09/11/2013   CLINICAL DATA:  Right pelvic pain for 2 weeks. Patient reports the left ovary was previously removed. Gravida 4 para 3. LMP 08/16/2013.  EXAM: TRANSABDOMINAL AND TRANSVAGINAL ULTRASOUND OF PELVIS  DOPPLER ULTRASOUND OF OVARIES  TECHNIQUE: Both transabdominal and transvaginal ultrasound examinations of the pelvis were performed. Transabdominal technique was performed for global imaging of the pelvis including uterus, ovaries, adnexal regions, and pelvic cul-de-sac.  It was necessary to proceed with endovaginal exam following the transabdominal exam to visualize the ovaries and adnexal region. Color and duplex Doppler ultrasound was utilized to evaluate blood flow to the ovaries.  COMPARISON:  Report of prior CT of the abdomen and pelvis on 11/20/2002 and report of prior ultrasound of the pelvis on 11/20/2002  FINDINGS: Uterus  Measurements: 9.7 x 6.8 x 7.2 cm. No fibroids or other mass visualized.  Endometrium  Thickness: 14.6 mm.  No focal abnormality visualized.  Right ovary  Measurements: A normal right ovary is not seen.  Left ovary  Measurements: 3.8 x 2.6 x 3.9 cm, seen only on transabdominal portion of the exam. Small follicles are seen, largest 2.6 cm.  Pulsed Doppler evaluation of both ovaries demonstrates normal low-resistance arterial and venous waveforms. Note is made of prominent right pelvic veins.  Other findings  No free fluid.  IMPRESSION: 1. Normal appearance of the uterus and endometrium. 2. Normal appearing ovary in the left adnexal region despite the history of left oophorectomy. 3. Nonvisualized right ovary. 4. Prominent right pelvic veins possibly accounting for the patient's pain. No evidence for mass or other acute abnormality. 5. The findings were discussed with [name] on 09/11/2013 at 4:06 PM.   Electronically Signed   By: Rosalie Gums M.D.   On: 09/11/2013 16:07     MDM   Final diagnoses:  Abdominal pain  Bacterial vaginosis    Nursing  notes including past medical history and social history reviewed and considered in documentation Labs/vital reviewed and considered   I personally performed the services described in this documentation, which was scribed in my presence. The recorded information has been reviewed and is accurate.       Joya Gaskins, MD 09/11/13 [id number]

## 2013-09-11 NOTE — Discharge Instructions (Signed)

## 2013-09-12 LAB — GC/CHLAMYDIA PROBE AMP
CT Probe RNA: NEGATIVE
GC PROBE AMP APTIMA: POSITIVE — AB

## 2013-09-16 ENCOUNTER — Telehealth (HOSPITAL_BASED_OUTPATIENT_CLINIC_OR_DEPARTMENT_OTHER): Payer: Self-pay | Admitting: Emergency Medicine

## 2013-09-16 NOTE — Telephone Encounter (Signed)
+  Gonorrhea. Chart sent to EDP office for review. [facility] attached. °

## 2013-09-24 ENCOUNTER — Telehealth (HOSPITAL_BASED_OUTPATIENT_CLINIC_OR_DEPARTMENT_OTHER): Payer: Self-pay

## 2013-09-24 NOTE — Telephone Encounter (Signed)
6/21 @ 2052 LVM requesting callback.

## 2013-10-01 ENCOUNTER — Telehealth (HOSPITAL_BASED_OUTPATIENT_CLINIC_OR_DEPARTMENT_OTHER): Payer: Self-pay | Admitting: Emergency Medicine

## 2013-10-01 NOTE — Telephone Encounter (Signed)
pt ID verified. Patient notified of positive gonorrhea and need for treatment with antibiotics. STD instructions provided, pt verbalized understanding. RX Cefixime and Doxycycline called to [employer] voicemail.  [facility] faxed

## 2014-06-16 ENCOUNTER — Emergency Department (HOSPITAL_COMMUNITY): Payer: Self-pay

## 2014-06-16 ENCOUNTER — Emergency Department (HOSPITAL_COMMUNITY)
Admission: EM | Admit: 2014-06-16 | Discharge: 2014-06-16 | Disposition: A | Discharge status: Home / Self Care | Payer: Self-pay | Attending: Emergency Medicine | Admitting: Emergency Medicine

## 2014-06-16 ENCOUNTER — Encounter (HOSPITAL_COMMUNITY): Payer: Self-pay | Admitting: Emergency Medicine

## 2014-06-16 DIAGNOSIS — Z79899 Other long term (current) drug therapy: Secondary | ICD-10-CM | POA: Insufficient documentation

## 2014-06-16 DIAGNOSIS — R42 Dizziness and giddiness: Secondary | ICD-10-CM | POA: Insufficient documentation

## 2014-06-16 DIAGNOSIS — Z720 Tobacco use: Secondary | ICD-10-CM | POA: Insufficient documentation

## 2014-06-16 DIAGNOSIS — R251 Tremor, unspecified: Secondary | ICD-10-CM | POA: Insufficient documentation

## 2014-06-16 DIAGNOSIS — M549 Dorsalgia, unspecified: Secondary | ICD-10-CM | POA: Insufficient documentation

## 2014-06-16 DIAGNOSIS — Z880 Allergy status to penicillin: Secondary | ICD-10-CM | POA: Insufficient documentation

## 2014-06-16 DIAGNOSIS — M542 Cervicalgia: Secondary | ICD-10-CM | POA: Insufficient documentation

## 2014-06-16 DIAGNOSIS — R531 Weakness: Secondary | ICD-10-CM | POA: Insufficient documentation

## 2014-06-16 LAB — COMPREHENSIVE METABOLIC PANEL
ALBUMIN: 4.4 g/dL (ref 3.5–5.2)
ALK PHOS: 61 U/L (ref 39–117)
ALT: 27 U/L (ref 0–35)
ANION GAP: 8 (ref 5–15)
AST: 32 U/L (ref 0–37)
BUN: 8 mg/dL (ref 6–23)
CALCIUM: 9.5 mg/dL (ref 8.4–10.5)
CO2: 25 mmol/L (ref 19–32)
CREATININE: 0.69 mg/dL (ref 0.50–1.10)
Chloride: 107 mmol/L (ref 96–112)
GFR calc Af Amer: >90 mL/min (ref >90)
GFR calc non Af Amer: >90 mL/min (ref >90)
Glucose, Bld: 125 mg/dL — ABNORMAL HIGH (ref 70–99)
POTASSIUM: 3.8 mmol/L (ref 3.5–5.1)
SODIUM: 140 mmol/L (ref 135–145)
TOTAL PROTEIN: 8.2 g/dL (ref 6.0–8.3)
Total Bilirubin: 0.6 mg/dL (ref 0.3–1.2)

## 2014-06-16 LAB — CBC WITH DIFFERENTIAL/PLATELET
BASOS ABS: 0 10*3/uL (ref 0.0–0.1)
BASOS PCT: 1 % (ref 0–1)
EOS ABS: 0.2 10*3/uL (ref 0.0–0.7)
EOS PCT: 3 % (ref 0–5)
HEMATOCRIT: 46.1 % — AB (ref 36.0–46.0)
HEMOGLOBIN: 15.6 g/dL — AB (ref 12.0–15.0)
LYMPHS ABS: 2 10*3/uL (ref 0.7–4.0)
LYMPHS PCT: 29 % (ref 12–46)
MCH: 32.8 pg (ref 26.0–34.0)
MCHC: 33.8 g/dL (ref 30.0–36.0)
MCV: 97.1 fL (ref 78.0–100.0)
MONOS PCT: 6 % (ref 3–12)
Monocytes Absolute: 0.4 10*3/uL (ref 0.1–1.0)
Neutro Abs: 4.4 10*3/uL (ref 1.7–7.7)
Neutrophils Relative %: 61 % (ref 43–77)
PLATELETS: 226 10*3/uL (ref 150–400)
RBC: 4.75 MIL/uL (ref 3.87–5.11)
RDW: 12.4 % (ref 11.5–15.5)
WBC: 7.1 10*3/uL (ref 4.0–10.5)

## 2014-06-16 LAB — TROPONIN I

## 2014-06-16 MED ORDER — FENTANYL CITRATE 0.05 MG/ML IJ SOLN
50.0000 ug | Freq: Once | INTRAMUSCULAR | Status: AC
  Administered 2014-06-16: 50 ug via INTRAVENOUS
  Filled 2014-06-16: qty 2

## 2014-06-16 MED ORDER — KETOROLAC TROMETHAMINE 30 MG/ML IJ SOLN
30.0000 mg | Freq: Once | INTRAMUSCULAR | Status: AC
  Administered 2014-06-16: 30 mg via INTRAVENOUS
  Filled 2014-06-16: qty 1

## 2014-06-16 MED ORDER — MECLIZINE HCL 12.5 MG PO TABS
25.0000 mg | ORAL_TABLET | Freq: Once | ORAL | Status: AC
  Administered 2014-06-16: 25 mg via ORAL
  Filled 2014-06-16: qty 2

## 2014-06-16 MED ORDER — LORAZEPAM 1 MG PO TABS
1.0000 mg | ORAL_TABLET | Freq: Once | ORAL | Status: AC
  Administered 2014-06-16: 1 mg via ORAL
  Filled 2014-06-16: qty 1

## 2014-06-16 MED ORDER — DIPHENHYDRAMINE HCL 50 MG/ML IJ SOLN
25.0000 mg | Freq: Once | INTRAMUSCULAR | Status: DC
Start: 2014-06-16 — End: 2014-06-16
  Filled 2014-06-16: qty 1

## 2014-06-16 MED ORDER — METOCLOPRAMIDE HCL 5 MG/ML IJ SOLN
10.0000 mg | Freq: Once | INTRAMUSCULAR | Status: AC
  Administered 2014-06-16: 10 mg via INTRAVENOUS
  Filled 2014-06-16: qty 2

## 2014-06-16 MED ORDER — MECLIZINE HCL 25 MG PO TABS: 25.0000 mg | ORAL_TABLET | Freq: Four times a day (QID) | ORAL | Status: DC | PRN

## 2014-06-16 MED ORDER — LORAZEPAM 1 MG PO TABS: 1.0000 mg | ORAL_TABLET | Freq: Three times a day (TID) | ORAL | Status: DC | PRN

## 2014-06-16 NOTE — ED Notes (Addendum)
Patient c/o extreme dizziness (room spinning) x3 days. Per patient spinning sensation laying, sitting, and standing. Patient reports being unable to ambulate due to dizziness. Reports nausea, tremors starting yesterday, and hot/cold sweats. Patient reports having some in neck and back (between shoulder blades). Patient also reports some heaviness in chest. Denies any slurred speech, facial drooping, or weakness on certain side.

## 2014-06-16 NOTE — ED Notes (Signed)
Patient with no complaints at this time. Respirations even and unlabored. Skin warm/dry. Discharge instructions reviewed with patient at this time. Patient given opportunity to voice concerns/ask questions. IV removed per policy and band-aid applied to site. Patient discharged at this time and left Emergency Department with steady gait.  

## 2014-06-16 NOTE — ED Provider Notes (Signed)
CSN: [id number]     Arrival date & time 06/16/14  0845 History  This chart was scribed for Donnetta Hutching, MD by Roxy Cedar, ED Scribe. This patient was seen in room APA02/APA02 and the patient's care was started at 9:41 AM.   Chief Complaint  Patient presents with  . Dizziness   Patient is a 49 y.o. female presenting with dizziness. The history is provided by the patient. No language interpreter was used.  Dizziness   HPI Comments: Deborah Hoffman is a 49 y.o. female with a PMHx of back surgery, abdominal surgery and laparoscopic salpingo oopherectomy, who presents to the Emergency Department complaining of moderate dizziness that began 3 days ago. She states that the walls and floor seem to be moving. She also reports associated weakness and tremors to right side of body including left arm, leg and neck that began yesterday. Patient also has associated left sided neck pain. She also reports stabbing upper back pain between her shoulder blades. Patient is a smoker and occasionally drinks alcohol. She denies taking medication prior to arrival. Patient does not have a PCP.  History reviewed. No pertinent past medical history. Past Surgical History  Procedure Laterality Date  . Back surgery    . Abdominal surgery    . Laparoscopic salpingo oopherectomy     History reviewed. No pertinent family history. History  Substance Use Topics  . Smoking status: Current Every Day Smoker -- 1.00 packs/day for 25 years    Types: Cigarettes  . Smokeless tobacco: Never Used  . Alcohol Use: Yes     Comment: occasional   OB History    Gravida Para Term Preterm AB TAB SAB Ectopic Multiple Living   4 3 3  1  1   3      Review of Systems  Neurological: Positive for dizziness.   A complete 10 system review of systems was obtained and all systems are negative except as noted in the HPI and PMH.  Allergies  Ambien; Benadryl; Neurontin; Nyquil multi-symptom; Other; Paxil; Penicillins; and Sulfur  Home  Medications   Prior to Admission medications   Medication Sig Start Date End Date Taking? Authorizing Provider  Multiple Vitamins-Minerals (ONE-A-DAY MENOPAUSE FORMULA PO) Take 1 tablet by mouth daily.   Yes Historical Provider, MD  LORazepam (ATIVAN) 1 MG tablet Take 1 tablet (1 mg total) by mouth 3 (three) times daily as needed for sedation. 06/16/14   08/16/14, MD  meclizine (ANTIVERT) 25 MG tablet Take 1 tablet (25 mg total) by mouth 4 (four) times daily as needed for dizziness. 06/16/14   08/16/14, MD  metroNIDAZOLE (FLAGYL) 500 MG tablet Take 1 tablet (500 mg total) by mouth 2 (two) times daily. One po bid x 7 days Patient not taking: Reported on 06/16/2014 09/11/13   11/11/13, MD  oxyCODONE-acetaminophen (PERCOCET/ROXICET) 5-325 MG per tablet Take 1 tablet by mouth every 4 (four) hours as needed for severe pain. Patient not taking: Reported on 06/16/2014 09/11/13   11/11/13, MD   Triage Vitals: BP 142/93 mmHg  Pulse 80  Temp(Src) 98.1 F (36.7 C) (Oral)  Resp 16  Ht 5\' 1"  (1.549 m)  Wt 145 lb (65.772 kg)  BMI 27.41 kg/m2  SpO2 100%  LMP 05/17/2014  Physical Exam  Constitutional: She is oriented to person, place, and time. She appears well-developed and well-nourished.  HENT:  Head: Normocephalic and atraumatic.  Eyes: Conjunctivae and EOM are normal. Pupils are equal, round, and reactive to light.  Neck: Normal range of motion. Neck supple.  Tenderness to left lateral neck  Cardiovascular: Normal rate and regular rhythm.   Pulmonary/Chest: Effort normal and breath sounds normal.  Abdominal: Soft. Bowel sounds are normal.  Musculoskeletal: Normal range of motion.  Neurological: She is alert and oriented to person, place, and time.  Slight tremulousness to right hand; slight weakness to left arm and left leg.  Skin: Skin is warm and dry.  Psychiatric: She has a normal mood and affect. Her behavior is normal.  Nursing note and vitals reviewed.  ED Course   Procedures (including critical care time)  DIAGNOSTIC STUDIES: Oxygen Saturation is 100% on RA, normal by my interpretation.    COORDINATION OF CARE: 9:47 AM- Discussed plans to order diagnostic CT of head and lab work. Pt advised of plan for treatment and pt agrees.  Results for orders placed or performed during the hospital encounter of 06/16/14  Troponin I  Result Value Ref Range   Troponin I <0.03 <0.031 ng/mL  Comprehensive metabolic panel  Result Value Ref Range   Sodium 140 135 - 145 mmol/L   Potassium 3.8 3.5 - 5.1 mmol/L   Chloride 107 96 - 112 mmol/L   CO2 25 19 - 32 mmol/L   Glucose, Bld 125 (H) 70 - 99 mg/dL   BUN 8 6 - 23 mg/dL   Creatinine, Ser 0.09 0.50 - 1.10 mg/dL   Calcium 9.5 8.4 - 23.3 mg/dL   Total Protein 8.2 6.0 - 8.3 g/dL   Albumin 4.4 3.5 - 5.2 g/dL   AST 32 0 - 37 U/L   ALT 27 0 - 35 U/L   Alkaline Phosphatase 61 39 - 117 U/L   Total Bilirubin 0.6 0.3 - 1.2 mg/dL   GFR calc non Af Amer >90 >90 mL/min   GFR calc Af Amer >90 >90 mL/min   Anion gap 8 5 - 15  CBC with Differential  Result Value Ref Range   WBC 7.1 4.0 - 10.5 K/uL   RBC 4.75 3.87 - 5.11 MIL/uL   Hemoglobin 15.6 (H) 12.0 - 15.0 g/dL   HCT 00.7 (H) 62.2 - 63.3 %   MCV 97.1 78.0 - 100.0 fL   MCH 32.8 26.0 - 34.0 pg   MCHC 33.8 30.0 - 36.0 g/dL   RDW 35.4 56.2 - 56.3 %   Platelets 226 150 - 400 K/uL   Neutrophils Relative % 61 43 - 77 %   Neutro Abs 4.4 1.7 - 7.7 K/uL   Lymphocytes Relative 29 12 - 46 %   Lymphs Abs 2.0 0.7 - 4.0 K/uL   Monocytes Relative 6 3 - 12 %   Monocytes Absolute 0.4 0.1 - 1.0 K/uL   Eosinophils Relative 3 0 - 5 %   Eosinophils Absolute 0.2 0.0 - 0.7 K/uL   Basophils Relative 1 0 - 1 %   Basophils Absolute 0.0 0.0 - 0.1 K/uL   Ct Head Wo Contrast  06/16/2014   CLINICAL DATA:  Dizziness for 3 days, vertigo, tremors in RIGHT arm and leg beginning yesterday, remote cervical cancer, smoker  EXAM: CT HEAD WITHOUT CONTRAST  TECHNIQUE: Contiguous axial images  were obtained from the base of the skull through the vertex without intravenous contrast.  COMPARISON:  None  FINDINGS: Normal ventricular morphology.  No midline shift or mass effect.  Normal appearance of brain parenchyma.  No intracranial hemorrhage, mass lesion, or acute infarction.  Visualized paranasal sinuses and mastoid air cells clear.  Bones unremarkable.  IMPRESSION: Normal exam.  If patient has persistent symptoms, consider MR imaging with and without contrast.   Electronically Signed   By: Ulyses Southward M.D.   On: 06/16/2014 11:57   Mr Brain Wo Contrast  06/16/2014   CLINICAL DATA:  Right-sided weakness and dizziness for 3 days.  EXAM: MRI HEAD WITHOUT CONTRAST  TECHNIQUE: Multiplanar, multiecho pulse sequences of the brain and surrounding structures were obtained without intravenous contrast.  COMPARISON:  CT head without contrast 06/16/2014.  FINDINGS: No acute infarct, hemorrhage, or mass lesion is present. The ventricles are of normal size. No significant extraaxial fluid collection is present.  No significant white matter disease is present.  Flow is present in the major intracranial arteries. The paranasal sinuses and mastoid air cells are clear.  The skullbase unremarkable. Midline structures are within normal limits.  IMPRESSION: Negative MRI of the brain.   Electronically Signed   By: Marin Roberts M.D.   On: 06/16/2014 12:26    Labs Review Labs Reviewed  COMPREHENSIVE METABOLIC PANEL - Abnormal; Notable for the following:    Glucose, Bld 125 (*)    All other components within normal limits  CBC WITH DIFFERENTIAL/PLATELET - Abnormal; Notable for the following:    Hemoglobin 15.6 (*)    HCT 46.1 (*)    All other components within normal limits  TROPONIN I   Imaging Review Ct Head Wo Contrast  06/16/2014   CLINICAL DATA:  Dizziness for 3 days, vertigo, tremors in RIGHT arm and leg beginning yesterday, remote cervical cancer, smoker  EXAM: CT HEAD WITHOUT CONTRAST   TECHNIQUE: Contiguous axial images were obtained from the base of the skull through the vertex without intravenous contrast.  COMPARISON:  None  FINDINGS: Normal ventricular morphology.  No midline shift or mass effect.  Normal appearance of brain parenchyma.  No intracranial hemorrhage, mass lesion, or acute infarction.  Visualized paranasal sinuses and mastoid air cells clear.  Bones unremarkable.  IMPRESSION: Normal exam.  If patient has persistent symptoms, consider MR imaging with and without contrast.   Electronically Signed   By: Ulyses Southward M.D.   On: 06/16/2014 11:57   Mr Brain Wo Contrast  06/16/2014   CLINICAL DATA:  Right-sided weakness and dizziness for 3 days.  EXAM: MRI HEAD WITHOUT CONTRAST  TECHNIQUE: Multiplanar, multiecho pulse sequences of the brain and surrounding structures were obtained without intravenous contrast.  COMPARISON:  CT head without contrast 06/16/2014.  FINDINGS: No acute infarct, hemorrhage, or mass lesion is present. The ventricles are of normal size. No significant extraaxial fluid collection is present.  No significant white matter disease is present.  Flow is present in the major intracranial arteries. The paranasal sinuses and mastoid air cells are clear.  The skullbase unremarkable. Midline structures are within normal limits.  IMPRESSION: Negative MRI of the brain.   Electronically Signed   By: Marin Roberts M.D.   On: 06/16/2014 12:26     EKG Interpretation   Date/Time:  Saturday June 16 2014 09:02:48 EST Ventricular Rate:  81 PR Interval:  128 QRS Duration: 84 QT Interval:  388 QTC Calculation: 450 R Axis:   85 Text Interpretation:  Sinus rhythm Probable anteroseptal infarct, old  Confirmed by Andromeda Poppen  MD, Lycia Sachdeva ([id number]) on 06/16/2014 9:25:30 AM     MDM   Final diagnoses:  Vertigo    Uncertain etiology of patient's ataxia and right arm tremulousness. CT head and MRI of brain were negative for acute findings. Basic labs including glucose were  normal. EKG normal. Patient is alert and oriented 3. Findings discussed with patient and her son. Rx Antivert 25 mg and Ativan 1 mg. Patient will return if worse or follow-up with primary care.  I personally performed the services described in this documentation, which was scribed in my presence. The recorded information has been reviewed and is accurate.   Donnetta Hutching, MD 06/16/14 [phone]

## 2014-06-16 NOTE — Discharge Instructions (Signed)
CT and MRI of head showed no acute findings. Medication for relaxation and dizziness. Return if worse. Resource guide given for follow-up.    Emergency Department Resource Guide 1) Find a Doctor and Pay Out of Pocket Although you won't have to find out who is covered by your insurance plan, it is a good idea to ask around and get recommendations. You will then need to call the office and see if the doctor you have chosen will accept you as a new patient and what types of options they offer for patients who are self-pay. Some doctors offer discounts or will set up payment plans for their patients who do not have insurance, but you will need to ask so you aren't surprised when you get to your appointment.  2) Contact Your Local Health Department Not all health departments have doctors that can see patients for sick visits, but many do, so it is worth a call to see if yours does. If you don't know where your local health department is, you can check in your phone book. The CDC also has a tool to help you locate your state's health department, and many state websites also have listings of all of their local health departments.  3) Find a Walk-in Clinic If your illness is not likely to be very severe or complicated, you may want to try a walk in clinic. These are popping up all over the country in pharmacies, drugstores, and shopping centers. They're usually staffed by nurse practitioners or physician assistants that have been trained to treat common illnesses and complaints. They're usually fairly quick and inexpensive. However, if you have serious medical issues or chronic medical problems, these are probably not your best option.  No Primary Care Doctor: - Call Health Connect at  [phone] - they can help you locate a primary care doctor that  accepts your insurance, provides certain services, etc. - Physician Referral Service- [phone]  Chronic Pain Problems: Organization          Address  Phone   Notes  [facility]  [phone] Patients need to be referred by their primary care doctor.   Medication Assistance: Organization         Address  Phone   Notes  [facility] Medication [facility] [address] [place] [place], [place] [address] [phone] --Must be a resident of [facility] -- Must have NO insurance coverage whatsoever (no Medicaid/ Medicare, etc.) -- The pt. MUST have a primary care doctor that directs their care regularly and follows them in the community   [facility]  [phone]   [employer]  [phone]    Agencies that provide inexpensive medical care: Organization         Address  Phone   Notes  [facility]  [phone]   [facility]    [phone]   [facility] [address] [place], [place] [address] [phone]   [facility] [address] [place], [place] [phone]   [facility]anned [facility]    [phone]   [facility]    [phone]   [facility] and [facility]  [address], [place] Phone:  [phone], Fax:  [phone] Hours of Operation:  9 am - 6 pm, M-F.  Also accepts Medicaid/Medicare and self-pay.  [facility]  [address], [place] Phone: [phone])  [phone], Fax: [phone]. Hours of Operation:  8:30 am - 5:30 pm, M-F.  Also accepts Medicaid and self-pay.  [facility] [place] [address], [place] Phone: [phone]   [place], [place], [place] [phone], Ext. [phone] Mondays & Thursdays: 7-9 AM.  First 15 patients are seen on a first come, first serve basis.    [place] Providers:  Organization         Address  Phone   Notes  [facility] [address], [place] [phone] Also accepts self-pay patients.  [facility] [address], [place]  [phone]   [place], [address], [place] [phone]   [facility] [address] [place] [phone]   Lucianne Lei [address], [place]   [phone] Only accepts [place] Access [place] patients after they have their name applied to their card.   Self-Pay (no insurance) in [facility]:  Organization         Address  Phone   Notes  Sickle Cell Patients, [facility] [place] [phone]   [facility] [place] [phone]   [facility] [place]  [place], [place], [place] [phone]   [facility]/Dr. Osei-Bonsu  [address] [place] or [address], [place] [phone] Phone number for both [place] and [place] locations is the same.  [facility] [address] [place] [phone]   [facility] [address], [place] or [address] [phone]   [facility] [address] [place] [phone], phone; [phone], fax Sees patients 1st and 3rd Saturday of every month.  Must not qualify for public or private insurance (i.e. Medicaid, Medicare, [place] Health Choice, Veterans' Benefits)  Household income should be no more than 200% of the poverty level The clinic cannot treat you if you are pregnant or think you are pregnant  Sexually transmitted diseases are not treated at the clinic.    Dental Care: Organization         Address  Phone  Notes  [facility] [place] [phone] Accepts children up to age 23 who are enrolled in [place] or [place]; pregnant women with a Medicaid card; and  children who have applied for Medicaid or [place] Health Choice, but were declined, whose parents can pay a reduced fee at time of service.  [facility]  [address], [place] [phone] Accepts children up to age 22 who are enrolled in [place] or [place]; pregnant women with a Medicaid card; and children who have applied for Medicaid or [place] Health Choice, but were declined, whose parents can pay a reduced fee at time of service.  [facility]  [place] [phone] Patients are seen by appointment only. Walk-ins are not accepted. [place] will see patients 70 years of age and older. Monday - Tuesday (8am-5pm) Most Wednesdays (8:30-5pm) $30 per visit, cash only  [facility]  [address], [place] [phone] Patients are seen by appointment only. Walk-ins are not accepted. [facility] will see patients 18 years of age and older. °One Wednesday Evening (Monthly: Volunteer Based).  $30 per visit, cash only  °[facility]  [phone] for adults; Children under age 4, call Graduate Pediatric Dentistry at [phone]. Children aged 4-14, please call [phone] to request a pediatric application. ° Dental services are provided in all areas of dental care including fillings, crowns and bridges, complete and partial dentures, implants, gum treatment, root canals, and extractions. Preventive care is also provided. Treatment is provided to both adults and children. °Patients are selected via a lottery and there is often a waiting list. °  °[facility] [address], °[place] ° ([phone]) [phone] [email or web address].[email or web address] °  °[facility] [address], [place], [place] ([phone], Ext. [phone] Second and Fourth Thursday of each month, opens at 6:30 AM; Clinic ends at 9 AM.  Patients are seen on a first-come first-served  basis, and a limited number are seen during each clinic.  ° °[facility] ° [address], [place], [place] [phone]) [phone]   Eligibility Requirements °You must have lived in [place], [place], or [place] counties for at least the last three months. °  You cannot be eligible for state or federal sponsored healthcare insurance, including Veterans Administration, Medicaid, or Medicare. °  You generally cannot be eligible for healthcare insurance through your employer.  °  How to apply: °Eligibility screenings are held every Tuesday and Wednesday afternoon from 1:00 pm until 4:00 pm. You do not need an appointment for the interview!  °[facility] [address], [place], [place] [phone]   °[facility]  336-342-8273   °[facility]  [phone]   °[facility]  336-570-6415   ° °Behavioral Health Resources in the Community: °Intensive Outpatient Programs °Organization         Address  Phone  Notes  °[facility] [address], [place], [place] [phone]   °[facility] [address], [place], [place] 336-832-9800   °[facility] [address], [place], [place] ° 336-882-2125   °[facility] [address],  °[place], [place] 1-800-853-5163 or 336-641-4981   °Substance Abuse Resources °Organization         Address  Phone  Notes  °[facility]  336-882-2125   °[facility]  336-784-9470   °[facility]  336-285-9073   °[facility]  336-845-3988   °Residential & Outpatient Substance Abuse Program  1-800-659-3381   °Psychological Services °Organization         Address  Phone  Notes  °[facility]  336- 832-9600   °[facility]  336- 378-7881   °[facility] [address], [place] 1-800-853-5163 or 336-641-4981   ° °Mobile Crisis Teams °Organization          Address  Phone  Notes  °[facility], Mobile Crisis Care Unit  1-877-626-1772   °Assertive °[facility] ° [address] [place], [place] 336-834-9664   °Sharon DeEsch [address] °[place] 336-554-5454   ° °Self-Help/Support Groups °Organization         Address  Phone             Notes  °[facility] [place] - variety of support groups    336- 373-1402 Call for more information  °Narcotics Anonymous (NA), [facility] [address], °[place]  2 meetings at this location  ° °Residential Treatment Programs °Organization         Address  Phone  Notes  °[facility] [address],    °[place]  1-866-801-8205   °[facility] ° [address], [place], [place] [phone]   °[facility] [address], [place] 336-845-3988 Admissions: 8am-3pm M-F  °[facility] [address]    °[place], [place] 336-841-1104   °[facility] [address], [place], [place] 336-379-7146   °[facility] [address]  °[place], [place] 336-285-9073   °[facility] - Intensive Outpatient [address], [place], [place] 336-852-3033   °[facility] [address]  °[place], [place] 1-877-615-2722 or 336-784-9470   °[facility] ([facility]) [address] [place], [place] 336-227-7417 Accepts Medicaid  °[facility] [address]  °[place] 1-800-659-3381 Substance Abuse/Addiction Treatment  ° °[place] [facility] Resources °Organization         Address  Phone  Notes  °[facility]  (888) 581-9988   °Julie Brannon, PhD [address] [place], [place]   (336) 349-5553 or (336) 951-0000   °[facility]   [address] °[place], [place] (336) 349-4454   °[facility] [address], [place], [place] (336) 342-8316 Insurance/Medicaid/sponsorship  through [facility]  °[facility] [address]                                    [place], [place] (336) 342-8316 Therapy/tele-psych/case  °[facility] [address]  ° [place], [place] (336) 349-2233    °Dr. Arfeen  (336) 349-4544   °[facility] [place]  [employer] [facility] [facility] 1) [address], [place] °2) [address], [place] °3)  [address], [place] (336) 349-3220 °(336) 342-7768 ° °(336) 342-8140   °[place] Child Abuse [facility]line (336) 342-1394 or (336) 342-3537 (After Hours)    ° ° °

## 2015-09-17 ENCOUNTER — Encounter (HOSPITAL_COMMUNITY): Payer: Self-pay | Admitting: Emergency Medicine

## 2015-09-17 ENCOUNTER — Emergency Department (HOSPITAL_COMMUNITY)
Admission: EM | Admit: 2015-09-17 | Discharge: 2015-09-17 | Disposition: A | Discharge status: Home / Self Care | Payer: Self-pay | Attending: Emergency Medicine | Admitting: Emergency Medicine

## 2015-09-17 DIAGNOSIS — R531 Weakness: Secondary | ICD-10-CM | POA: Insufficient documentation

## 2015-09-17 DIAGNOSIS — Z79899 Other long term (current) drug therapy: Secondary | ICD-10-CM | POA: Insufficient documentation

## 2015-09-17 DIAGNOSIS — F17210 Nicotine dependence, cigarettes, uncomplicated: Secondary | ICD-10-CM | POA: Insufficient documentation

## 2015-09-17 LAB — CBC WITH DIFFERENTIAL/PLATELET
BASOS ABS: 0 10*3/uL (ref 0.0–0.1)
BASOS PCT: 0 %
Eosinophils Absolute: 0.2 10*3/uL (ref 0.0–0.7)
Eosinophils Relative: 2 %
HEMATOCRIT: 47 % — AB (ref 36.0–46.0)
HEMOGLOBIN: 16 g/dL — AB (ref 12.0–15.0)
Lymphocytes Relative: 36 %
Lymphs Abs: 2.6 10*3/uL (ref 0.7–4.0)
MCH: 32.2 pg (ref 26.0–34.0)
MCHC: 34 g/dL (ref 30.0–36.0)
MCV: 94.6 fL (ref 78.0–100.0)
Monocytes Absolute: 0.3 10*3/uL (ref 0.1–1.0)
Monocytes Relative: 4 %
NEUTROS PCT: 58 %
Neutro Abs: 4.1 10*3/uL (ref 1.7–7.7)
Platelets: 220 10*3/uL (ref 150–400)
RBC: 4.97 MIL/uL (ref 3.87–5.11)
RDW: 12.2 % (ref 11.5–15.5)
WBC: 7.2 10*3/uL (ref 4.0–10.5)

## 2015-09-17 LAB — BASIC METABOLIC PANEL
ANION GAP: 10 (ref 5–15)
BUN: 9 mg/dL (ref 6–20)
CHLORIDE: 103 mmol/L (ref 101–111)
CO2: 22 mmol/L (ref 22–32)
Calcium: 9.2 mg/dL (ref 8.9–10.3)
Creatinine, Ser: 0.74 mg/dL (ref 0.44–1.00)
GFR calc non Af Amer: >60 mL/min (ref >60)
Glucose, Bld: 97 mg/dL (ref 65–99)
Potassium: 3.7 mmol/L (ref 3.5–5.1)
SODIUM: 135 mmol/L (ref 135–145)

## 2015-09-17 LAB — URINE MICROSCOPIC-ADD ON

## 2015-09-17 LAB — HEPATIC FUNCTION PANEL
ALBUMIN: 4.8 g/dL (ref 3.5–5.0)
ALK PHOS: 96 U/L (ref 38–126)
ALT: 26 U/L (ref 14–54)
AST: 29 U/L (ref 15–41)
Bilirubin, Direct: <0.1 mg/dL — ABNORMAL LOW (ref 0.1–0.5)
TOTAL PROTEIN: 8.3 g/dL — AB (ref 6.5–8.1)
Total Bilirubin: 0.8 mg/dL (ref 0.3–1.2)

## 2015-09-17 LAB — URINALYSIS, ROUTINE W REFLEX MICROSCOPIC
BILIRUBIN URINE: NEGATIVE
Glucose, UA: NEGATIVE mg/dL
Hgb urine dipstick: NEGATIVE
NITRITE: NEGATIVE
PROTEIN: NEGATIVE mg/dL
SPECIFIC GRAVITY, URINE: 1.02 (ref 1.005–1.030)
pH: 5.5 (ref 5.0–8.0)

## 2015-09-17 LAB — LIPASE, BLOOD: LIPASE: 18 U/L (ref 11–51)

## 2015-09-17 MED ORDER — KETOROLAC TROMETHAMINE 30 MG/ML IJ SOLN
30.0000 mg | Freq: Once | INTRAMUSCULAR | Status: AC
  Administered 2015-09-17: 30 mg via INTRAVENOUS
  Filled 2015-09-17: qty 1

## 2015-09-17 MED ORDER — SODIUM CHLORIDE 0.9 % IV BOLUS (SEPSIS)
1000.0000 mL | Freq: Once | INTRAVENOUS | Status: AC
  Administered 2015-09-17: 1000 mL via INTRAVENOUS

## 2015-09-17 MED ORDER — DOXYCYCLINE HYCLATE 100 MG PO CAPS: 100.0000 mg | ORAL_CAPSULE | Freq: Two times a day (BID) | ORAL | Status: DC

## 2015-09-17 MED ORDER — PROMETHAZINE HCL 25 MG PO TABS: 25.0000 mg | ORAL_TABLET | Freq: Four times a day (QID) | ORAL | Status: DC | PRN

## 2015-09-17 MED ORDER — NAPROXEN 500 MG PO TABS: ORAL_TABLET | ORAL | Status: DC

## 2015-09-17 MED ORDER — ONDANSETRON HCL 4 MG/2ML IJ SOLN
4.0000 mg | Freq: Once | INTRAMUSCULAR | Status: AC
  Administered 2015-09-17: 4 mg via INTRAVENOUS
  Filled 2015-09-17: qty 2

## 2015-09-17 NOTE — Discharge Instructions (Signed)
Follow up with dr. Felecia Shelling or another family md in 1 week

## 2015-09-17 NOTE — ED Notes (Signed)
Pt here with multiple complaints. Pt c/o generalized body aches, fatigue, n/v, dizziness, and loss of appetite since Friday. States she initially thought it was food poisoning. Pt reports she was bit by a tick a month ago.

## 2015-09-18 NOTE — ED Provider Notes (Signed)
CSN: [id number]     Arrival date & time 09/17/15  1701 History   First MD Initiated Contact with Patient 09/17/15 1926     Chief Complaint  Patient presents with  . Generalized Body Aches     (Consider location/radiation/quality/duration/timing/severity/associated sxs/prior Treatment) Patient is a 50 y.o. female presenting with weakness. The history is provided by the patient (The patient complains of muscle aches and weakness for over a week. She states that she removed a tick from her a few weeks ago. No fevers chills).  Weakness This is a new problem. The current episode started more than 1 week ago. The problem occurs constantly. The problem has not changed since onset.Pertinent negatives include no chest pain, no abdominal pain and no headaches. Nothing aggravates the symptoms. Nothing relieves the symptoms.    History reviewed. No pertinent past medical history. Past Surgical History  Procedure Laterality Date  . Back surgery    . Abdominal surgery    . Laparoscopic salpingo oopherectomy    . Tonsillectomy     No family history on file. Social History  Substance Use Topics  . Smoking status: Current Every Day Smoker -- 1.00 packs/day for 25 years    Types: Cigarettes  . Smokeless tobacco: Never Used  . Alcohol Use: Yes     Comment: occasional   OB History    Gravida Para Term Preterm AB TAB SAB Ectopic Multiple Living   4 3 3  1  1   3      Review of Systems  Constitutional: Negative for appetite change and fatigue.  HENT: Negative for congestion, ear discharge and sinus pressure.   Eyes: Negative for discharge.  Respiratory: Negative for cough.   Cardiovascular: Negative for chest pain.  Gastrointestinal: Negative for abdominal pain and diarrhea.  Genitourinary: Negative for frequency and hematuria.  Musculoskeletal: Positive for myalgias. Negative for back pain.  Skin: Negative for rash.  Neurological: Positive for weakness. Negative for seizures and headaches.   Psychiatric/Behavioral: Negative for hallucinations.      Allergies  Ambien; Benadryl; Neurontin; Nyquil multi-symptom; Other; Paxil; Penicillins; and Sulfur  Home Medications   Prior to Admission medications   Medication Sig Start Date End Date Taking? Authorizing Provider  acetaminophen (NON-ASPIRIN) 325 MG tablet Take 650-975 mg by mouth 3 (three) times daily as needed (for back pain).   Yes Historical Provider, MD  Multiple Vitamins-Minerals (ONE-A-DAY MENOPAUSE FORMULA PO) Take 1 tablet by mouth daily.   Yes Historical Provider, MD  doxycycline (VIBRAMYCIN) 100 MG capsule Take 1 capsule (100 mg total) by mouth 2 (two) times daily. One po bid x 7 days 09/17/15   09/19/15, MD  naproxen (NAPROSYN) 500 MG tablet Take one every 12 hours for pain 09/17/15   09/19/15, MD  promethazine (PHENERGAN) 25 MG tablet Take 1 tablet (25 mg total) by mouth every 6 (six) hours as needed for nausea or vomiting. 09/17/15   09/19/15, MD   BP 132/78 mmHg  Pulse 65  Temp(Src) 98.2 F (36.8 C) (Oral)  Resp 16  Ht 5\' 1"  (1.549 m)  Wt 140 lb (63.504 kg)  BMI 26.47 kg/m2  SpO2 100%  LMP 05/18/2014 (Approximate) Physical Exam  Constitutional: She is oriented to person, place, and time. She appears well-developed.  HENT:  Head: Normocephalic.  Eyes: Conjunctivae and EOM are normal. No scleral icterus.  Neck: Neck supple. No thyromegaly present.  Cardiovascular: Normal rate and regular rhythm.  Exam reveals no gallop and no friction rub.  No murmur heard. Pulmonary/Chest: No stridor. She has no wheezes. She has no rales. She exhibits no tenderness.  Abdominal: She exhibits no distension. There is no tenderness. There is no rebound.  Musculoskeletal: Normal range of motion. She exhibits no edema.  Lymphadenopathy:    She has no cervical adenopathy.  Neurological: She is oriented to person, place, and time. She exhibits normal muscle tone. Coordination normal.  Skin: No rash noted. No  erythema.  Psychiatric: She has a normal mood and affect. Her behavior is normal.    ED Course  Procedures (including critical care time) Labs Review Labs Reviewed  CBC WITH DIFFERENTIAL/PLATELET - Abnormal; Notable for the following:    Hemoglobin 16.0 (*)    HCT 47.0 (*)    All other components within normal limits  URINALYSIS, ROUTINE W REFLEX MICROSCOPIC (NOT AT [facility]) - Abnormal; Notable for the following:    Ketones, ur TRACE (*)    Leukocytes, UA TRACE (*)    All other components within normal limits  HEPATIC FUNCTION PANEL - Abnormal; Notable for the following:    Total Protein 8.3 (*)    Bilirubin, Direct <0.1 (*)    All other components within normal limits  URINE MICROSCOPIC-ADD ON - Abnormal; Notable for the following:    Squamous Epithelial / LPF TOO NUMEROUS TO COUNT (*)    Bacteria, UA MANY (*)    All other components within normal limits  BASIC METABOLIC PANEL  LIPASE, BLOOD  B. BURGDORFI ANTIBODIES  ROCKY MTN SPOTTED FVR ABS PNL(IGG+IGM)    Imaging Review No results found. I have personally reviewed and evaluated these images and lab results as part of my medical decision-making.   EKG Interpretation None      MDM   Final diagnoses:  Weakness    Patient weakness and myalgias. History of tick exposure. [facility] spotted fever and Lyme's titers have been sent. Patient will be placed on doxycycline and will follow-up with PCP    Bethann Berkshire, MD 09/18/15 1201

## 2015-09-19 LAB — B. BURGDORFI ANTIBODIES: B burgdorferi Ab IgG+IgM: <0.91 {ISR} (ref 0.00–0.90)

## 2015-09-20 LAB — ROCKY MTN SPOTTED FVR ABS PNL(IGG+IGM)
RMSF IgG: NEGATIVE
RMSF IgM: 0.2 index (ref 0.00–0.89)

## 2017-01-10 ENCOUNTER — Emergency Department (HOSPITAL_COMMUNITY)
Admission: EM | Admit: 2017-01-10 | Discharge: 2017-01-10 | Disposition: A | Discharge status: Home / Self Care | Payer: Self-pay | Attending: Emergency Medicine | Admitting: Emergency Medicine

## 2017-01-10 ENCOUNTER — Encounter (HOSPITAL_COMMUNITY): Payer: Self-pay | Admitting: Emergency Medicine

## 2017-01-10 DIAGNOSIS — M542 Cervicalgia: Secondary | ICD-10-CM | POA: Insufficient documentation

## 2017-01-10 DIAGNOSIS — F17210 Nicotine dependence, cigarettes, uncomplicated: Secondary | ICD-10-CM | POA: Insufficient documentation

## 2017-01-10 MED ORDER — DEXAMETHASONE SODIUM PHOSPHATE 4 MG/ML IJ SOLN
4.0000 mg | Freq: Once | INTRAMUSCULAR | Status: AC
  Administered 2017-01-10: 4 mg via INTRAVENOUS
  Filled 2017-01-10: qty 1

## 2017-01-10 MED ORDER — OXYCODONE-ACETAMINOPHEN 5-325 MG PO TABS: 1.0000 | ORAL_TABLET | Freq: Four times a day (QID) | ORAL | 0 refills | Status: DC | PRN

## 2017-01-10 MED ORDER — CYCLOBENZAPRINE HCL 10 MG PO TABS: 10.0000 mg | ORAL_TABLET | Freq: Two times a day (BID) | ORAL | 0 refills | Status: AC | PRN

## 2017-01-10 MED ORDER — HYDROMORPHONE HCL 1 MG/ML IJ SOLN
1.0000 mg | Freq: Once | INTRAMUSCULAR | Status: AC
  Administered 2017-01-10: 1 mg via INTRAVENOUS
  Filled 2017-01-10: qty 1

## 2017-01-10 NOTE — ED Provider Notes (Signed)
[facility]-EMERGENCY DEPT Provider Note   CSN: [id number] Arrival date & time: 01/10/17  1257     History   Chief Complaint Chief Complaint  Patient presents with  . Neck Pain    HPI Deborah Hoffman is a 51 y.o. female resented to the ED with persistent neck pain since August. 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 He was seeing Dr. Case, an orthopedic specialist in [place], who referred her to Dr. Channing Mutters, neurosurgeon [place]. She states she has been having difficulty since August obtaining an appointment with Dr. Channing Mutters. She states Dr. Case informed her that her neck pathology was emergent and needing emergent surgery, and this has been causing her much worry. She states her pain was being treated with hydrocodone or oxycodone, however Dr. Case has not been prescribing her anymore medication since August. She states she has been taking ibuprofen at home without relief. States her neck is very stiff, with decreased range of motion and with radiating pains down her right arm and some down her left. Denies new or recent injuries.  The history is provided by the patient.    History reviewed. No pertinent past medical history.  There are no active problems to display for this patient.   Past Surgical History:  Procedure Laterality Date  . ABDOMINAL SURGERY    . BACK SURGERY    . LAPAROSCOPIC SALPINGO OOPHERECTOMY    . TONSILLECTOMY      OB History    Gravida Para Term Preterm AB Living   4 3 3   1 3    SAB TAB Ectopic Multiple Live Births   1               Home Medications    Prior to Admission medications   Medication Sig Start Date End Date Taking? Authorizing Provider  acetaminophen (NON-ASPIRIN) 325 MG tablet Take 650-975 mg by mouth 3 (three) times daily as needed (for back pain).    [provider]  cyclobenzaprine (FLEXERIL) 10 MG tablet Take 1 tablet (10 mg total) by mouth 2 (two)  times daily as needed for muscle spasms. 01/10/17   Russo, 03/12/17 N, PA-C  doxycycline (VIBRAMYCIN) 100 MG capsule Take 1 capsule (100 mg total) by mouth 2 (two) times daily. One po bid x 7 days 09/17/15   09/19/15, MD  Multiple Vitamins-Minerals (ONE-A-DAY MENOPAUSE FORMULA PO) Take 1 tablet by mouth daily.    [provider]  naproxen (NAPROSYN) 500 MG tablet Take one every 12 hours for pain 09/17/15   09/19/15, MD  oxyCODONE-acetaminophen (PERCOCET/ROXICET) 5-325 MG tablet Take 1-2 tablets by mouth every 6 (six) hours as needed for severe pain. 01/10/17   Russo, 03/12/17 N, PA-C  promethazine (PHENERGAN) 25 MG tablet Take 1 tablet (25 mg total) by mouth every 6 (six) hours as needed for nausea or vomiting. 09/17/15   09/19/15, MD    Family History No family history on file.  Social History Social History  Substance Use Topics  . Smoking status: Current Every Day Smoker    Packs/day: 1.00    Years: 25.00    Types: Cigarettes  . Smokeless tobacco: Never Used  . Alcohol use Yes     Comment: occasional     Allergies   Ambien [zolpidem]; Benadryl [diphenhydramine hcl (sleep)]; Neurontin [gabapentin]; Nyquil multi-symptom [pseudoeph-doxylamine-dm-apap]; Other; Paxil [paroxetine hcl]; Penicillins; and Sulfur   Review of Systems Review of Systems  Musculoskeletal:  Positive for neck pain and neck stiffness.  Neurological: Positive for weakness. Negative for numbness.     Physical Exam Updated Vital Signs BP (!) 131/97   Pulse 83   Temp 98.5 F (36.9 C)   Resp 20   LMP 05/18/2014 (Approximate)   SpO2 96%   Physical Exam  Constitutional: She appears well-developed and well-nourished.  Pt tearful, holding neck very still while speaking.  HENT:  Head: Normocephalic and atraumatic.  Eyes: Conjunctivae are normal.  Cardiovascular: Normal rate, regular rhythm, normal heart sounds and intact distal pulses.   Pulmonary/Chest: Effort normal and breath sounds  normal.  Abdominal: Soft.  Musculoskeletal:  C-spine and paraspinal tenderness. No bony step-offs or gross deformities. Bilateral trapezius muscles with spasm. Decreased range of motion of neck with left and right motions.  Neurological: She is alert. She displays normal reflexes. No sensory deficit. She exhibits normal muscle tone.  Skin: Skin is warm.  Psychiatric: She has a normal mood and affect. Her behavior is normal.  Nursing note and vitals reviewed.    ED Treatments / Results  Labs (all labs ordered are listed, but only abnormal results are displayed) Labs Reviewed - No data to display  EKG  EKG Interpretation None       Radiology No results found.  Procedures Procedures (including critical care time)  Medications Ordered in ED Medications  HYDROmorphone (DILAUDID) injection 1 mg (1 mg Intravenous Given 01/10/17 1410)  dexamethasone (DECADRON) injection 4 mg (4 mg Intravenous Given 01/10/17 1411)     Initial Impression / Assessment and Plan / ED Course  I have reviewed the triage vital signs and the nursing notes.  Pertinent labs & imaging results that were available during my care of the patient were reviewed by me and considered in my medical decision making (see chart for details).      With recent diagnosis of cervical stenosis and neural foraminal stenosis, presenting with persistent neck pain that is not relieved with Advil. Patient reports having difficulty obtaining an appointment with neurosurgeon in [place] and is unable to control symptoms at home with Advil. No new injuries, or new symptoms. Slightly decreased grip strength on right upper extremity versus left, however no significant neuro deficits on exam. Pain treated in ED with Dilaudid, Decadron given. Pain improved in ED. Will discharge with pain medication, Flexeril, and local neurosurgery referral. Return precautions discussed.  Pt discussed with Dr. Deretha Emory.   [place] Controlled  Substance reporting System queried  Discussed results, findings, treatment and follow up. Patient advised of return precautions. Patient verbalized understanding and agreed with plan.  Final Clinical Impressions(s) / ED Diagnoses   Final diagnoses:  Neck pain    New Prescriptions Discharge Medication List as of 01/10/2017  2:39 PM    START taking these medications   Details  cyclobenzaprine (FLEXERIL) 10 MG tablet Take 1 tablet (10 mg total) by mouth 2 (two) times daily as needed for muscle spasms., Starting Sun 01/10/2017, Print    oxyCODONE-acetaminophen (PERCOCET/ROXICET) 5-325 MG tablet Take 1-2 tablets by mouth every 6 (six) hours as needed for severe pain., Starting Sun 01/10/2017, Print         Timothy Lasso, [place] N, PA-C 01/10/17 1512    Vanetta Mulders, MD 01/12/17 [phone]

## 2017-01-10 NOTE — Discharge Instructions (Signed)
Please read instructions below. Apply ice to your neck for 20 minutes at a time. You can also apply heat this provides you with more relief. You can take 1-2 tabs of oxycodone every 6 hours as needed for pain. Do not take tylenol, drive, or drink alcohol while taking this medication.  You can take Flexeril 12 hours as needed for muscle spasm. Schedule an appointment with the neurosurgery specialist to follow-up on your neck pain. Return to the ER for new or concerning symptoms.

## 2017-01-10 NOTE — ED Notes (Signed)
PA at bedside.

## 2017-01-10 NOTE — ED Triage Notes (Signed)
Pt here from [place]. States she had MRI done, had pain to shoulder and neck. Was diagnosed with C3-C4 spinal stenosis, with c4 c5 right neural foraminal stenosis, disc protrusion. Pt states they told her she needed emergency surgery. Saw her MD Case, orthopedic,  who referred her to MD Channing Mutters who is a Midwife. Pt states none of them will call her back now to schedule the surgery, and that none of them will prescribe her any more pain medication. Pt tearful at triage. States little ROM in neck.

## 2017-01-10 NOTE — ED Notes (Signed)
ED Provider at bedside. 

## 2017-01-20 ENCOUNTER — Other Ambulatory Visit: Payer: Self-pay | Admitting: Neurosurgery

## 2017-01-21 ENCOUNTER — Other Ambulatory Visit: Payer: Self-pay | Admitting: Neurosurgery

## 2017-01-26 NOTE — Pre-Procedure Instructions (Signed)
COLBIE SLIKER  01/26/2017      [id number] - [place], [place] - [address] OF [address] [place] [address] Phone: [phone] Fax: [phone]    Your procedure is scheduled on Thursday, February 04, 2017  Report to [facility] Admitting Entrance "A" at 5:30 A.M.  Call this number if you have problems the morning of surgery:  [phone]   Remember:  Do not eat food or drink liquids after midnight.  Take these medicines the morning of surgery with A SIP OF WATER: If needed OxyCODONE-acetaminophen (PERCOCET/ROXICET) for pain, cyclobenzaprine (FLEXERIL) for spasms, and Promethazine (PHENERGAN) for nausea  As of today, stop taking all Aspirins, Vitamins, Fish oils, and Herbal medications. Also stop all NSAIDS i.e. Advil, Ibuprofen, Motrin, Aleve, Anaprox, Naproxen, BC and Goody Powders.   Do not wear jewelry, make-up or nail polish.  Do not wear lotions, powders, perfumes, or deodorant.  Do not shave 48 hours prior to surgery.    Do not bring valuables to the hospital.  [facility] is not responsible for any belongings or valuables.  Contacts, dentures or bridgework may not be worn into surgery.  Leave your suitcase in the car.  After surgery it may be brought to your room.  For patients admitted to the hospital, discharge time will be determined by your treatment team.  Patients discharged the day of surgery will not be allowed to drive home.   Special instructions:   [facility]- Preparing For Surgery  Before surgery, you can play an important role. Because skin is not sterile, your skin needs to be as free of germs as possible. You can reduce the number of germs on your skin by washing with CHG (chlorahexidine gluconate) Soap before surgery.  CHG is an antiseptic cleaner which kills germs and bonds with the skin to continue killing germs even after washing.  Please do not use if you have an  allergy to CHG or antibacterial soaps. If your skin becomes reddened/irritated stop using the CHG.  Do not shave (including legs and underarms) for at least 48 hours prior to first CHG shower. It is OK to shave your face.  Please follow these instructions carefully.   1. Shower the NIGHT BEFORE SURGERY and the MORNING OF SURGERY with CHG.   2. If you chose to wash your hair, wash your hair first as usual with your normal shampoo.  3. After you shampoo, rinse your hair and body thoroughly to remove the shampoo.  4. Use CHG as you would any other liquid soap. You can apply CHG directly to the skin and wash gently with a scrungie or a clean washcloth.   5. Apply the CHG Soap to your body ONLY FROM THE NECK DOWN.  Do not use on open wounds or open sores. Avoid contact with your eyes, ears, mouth and genitals (private parts). Wash Face and genitals (private parts)  with your normal soap.  6. Wash thoroughly, paying special attention to the area where your surgery will be performed.  7. Thoroughly rinse your body with warm water from the neck down.  8. DO NOT shower/wash with your normal soap after using and rinsing off the CHG Soap.  9. Pat yourself dry with a CLEAN TOWEL.  10. Wear CLEAN PAJAMAS to bed the night before surgery, wear comfortable clothes the morning of surgery  11. Place CLEAN SHEETS on your bed  the night of your first shower and DO NOT SLEEP WITH PETS.  Day of Surgery: Do not apply any deodorants/lotions. Please wear clean clothes to the hospital/surgery center.    Please read over the following fact sheets that you were given. Pain Booklet, Coughing and Deep Breathing, Blood Transfusion Information, MRSA Information and Surgical Site Infection Prevention

## 2017-01-27 ENCOUNTER — Other Ambulatory Visit: Payer: Self-pay

## 2017-01-27 ENCOUNTER — Encounter (HOSPITAL_COMMUNITY)
Admission: RE | Admit: 2017-01-27 | Discharge: 2017-01-27 | Disposition: A | Discharge status: Home / Self Care | Payer: Self-pay | Source: Ambulatory Visit | Attending: Neurosurgery | Admitting: Neurosurgery

## 2017-01-27 ENCOUNTER — Encounter (HOSPITAL_COMMUNITY): Payer: Self-pay

## 2017-01-27 DIAGNOSIS — R011 Cardiac murmur, unspecified: Secondary | ICD-10-CM

## 2017-01-27 DIAGNOSIS — M4802 Spinal stenosis, cervical region: Secondary | ICD-10-CM | POA: Insufficient documentation

## 2017-01-27 DIAGNOSIS — Z01818 Encounter for other preprocedural examination: Secondary | ICD-10-CM | POA: Insufficient documentation

## 2017-01-27 DIAGNOSIS — M069 Rheumatoid arthritis, unspecified: Secondary | ICD-10-CM

## 2017-01-27 HISTORY — DX: Cardiac murmur, unspecified: R01.1

## 2017-01-27 HISTORY — DX: Rheumatoid arthritis, unspecified: M06.9

## 2017-01-27 HISTORY — DX: Unspecified osteoarthritis, unspecified site: M19.90

## 2017-01-27 LAB — CBC
HEMATOCRIT: 46.7 % — AB (ref 36.0–46.0)
Hemoglobin: 16 g/dL — ABNORMAL HIGH (ref 12.0–15.0)
MCH: 32.3 pg (ref 26.0–34.0)
MCHC: 34.3 g/dL (ref 30.0–36.0)
MCV: 94.2 fL (ref 78.0–100.0)
Platelets: 227 10*3/uL (ref 150–400)
RBC: 4.96 MIL/uL (ref 3.87–5.11)
RDW: 12.3 % (ref 11.5–15.5)
WBC: 7.1 10*3/uL (ref 4.0–10.5)

## 2017-01-27 LAB — ABO/RH: ABO/RH(D): A POS

## 2017-01-27 LAB — BASIC METABOLIC PANEL
Anion gap: 11 (ref 5–15)
BUN: 13 mg/dL (ref 6–20)
CALCIUM: 9.5 mg/dL (ref 8.9–10.3)
CHLORIDE: 104 mmol/L (ref 101–111)
CO2: 21 mmol/L — AB (ref 22–32)
CREATININE: 0.7 mg/dL (ref 0.44–1.00)
GFR calc Af Amer: >60 mL/min (ref >60)
GFR calc non Af Amer: >60 mL/min (ref >60)
Glucose, Bld: 109 mg/dL — ABNORMAL HIGH (ref 65–99)
Potassium: 3.9 mmol/L (ref 3.5–5.1)
Sodium: 136 mmol/L (ref 135–145)

## 2017-01-27 LAB — TYPE AND SCREEN
ABO/RH(D): A POS
ANTIBODY SCREEN: NEGATIVE

## 2017-01-27 LAB — SURGICAL PCR SCREEN
MRSA, PCR: NEGATIVE
Staphylococcus aureus: NEGATIVE

## 2017-01-27 MED ORDER — CHLORHEXIDINE GLUCONATE CLOTH 2 % EX PADS: 6.0000 | MEDICATED_PAD | Freq: Once | CUTANEOUS | Status: DC

## 2017-01-27 NOTE — Progress Notes (Addendum)
PCP: pt denies -pt has not had a physical since 2004 per pt recall  Cardiologist: pt denies  EKG: pt denies past year  Stress test: pt denies ever  ECHO: pt denies ever  Cardiac Cath: pt denies ever  Chest x-ray: pt denies past year-no recent respiratory infections/complications

## 2017-02-04 ENCOUNTER — Encounter (HOSPITAL_COMMUNITY): Payer: Self-pay | Admitting: *Deleted

## 2017-02-04 ENCOUNTER — Inpatient Hospital Stay (HOSPITAL_COMMUNITY)
Admission: RE | Admit: 2017-02-04 | Discharge: 2017-02-05 | DRG: 472 | Disposition: A | Discharge status: Home / Self Care | Payer: Self-pay | Source: Ambulatory Visit | Attending: Neurosurgery | Admitting: Neurosurgery

## 2017-02-04 ENCOUNTER — Inpatient Hospital Stay (HOSPITAL_COMMUNITY): Payer: Self-pay | Admitting: Certified Registered"

## 2017-02-04 ENCOUNTER — Encounter (HOSPITAL_COMMUNITY)
Admission: RE | Disposition: A | Discharge status: Home / Self Care | Payer: Self-pay | Source: Ambulatory Visit | Attending: Neurosurgery

## 2017-02-04 ENCOUNTER — Inpatient Hospital Stay (HOSPITAL_COMMUNITY): Payer: Self-pay

## 2017-02-04 DIAGNOSIS — Z9889 Other specified postprocedural states: Secondary | ICD-10-CM

## 2017-02-04 DIAGNOSIS — F17210 Nicotine dependence, cigarettes, uncomplicated: Secondary | ICD-10-CM | POA: Diagnosis present

## 2017-02-04 DIAGNOSIS — Z882 Allergy status to sulfonamides status: Secondary | ICD-10-CM

## 2017-02-04 DIAGNOSIS — M5000 Cervical disc disorder with myelopathy, unspecified cervical region: Secondary | ICD-10-CM | POA: Diagnosis present

## 2017-02-04 DIAGNOSIS — M4802 Spinal stenosis, cervical region: Principal | ICD-10-CM | POA: Diagnosis present

## 2017-02-04 DIAGNOSIS — Z419 Encounter for procedure for purposes other than remedying health state, unspecified: Secondary | ICD-10-CM

## 2017-02-04 DIAGNOSIS — Z880 Allergy status to penicillin: Secondary | ICD-10-CM

## 2017-02-04 DIAGNOSIS — Z79899 Other long term (current) drug therapy: Secondary | ICD-10-CM

## 2017-02-04 DIAGNOSIS — R011 Cardiac murmur, unspecified: Secondary | ICD-10-CM | POA: Diagnosis present

## 2017-02-04 DIAGNOSIS — M479 Spondylosis, unspecified: Secondary | ICD-10-CM | POA: Diagnosis present

## 2017-02-04 DIAGNOSIS — Z888 Allergy status to other drugs, medicaments and biological substances status: Secondary | ICD-10-CM

## 2017-02-04 DIAGNOSIS — M069 Rheumatoid arthritis, unspecified: Secondary | ICD-10-CM | POA: Diagnosis present

## 2017-02-04 HISTORY — PX: ANTERIOR CERVICAL DECOMPRESSION/DISCECTOMY FUSION 4 LEVELS: SHX5556

## 2017-02-04 SURGERY — ANTERIOR CERVICAL DECOMPRESSION/DISCECTOMY FUSION 4 LEVELS
Anesthesia: General | Site: Neck

## 2017-02-04 MED ORDER — MEPERIDINE HCL 25 MG/ML IJ SOLN: 6.2500 mg | INTRAMUSCULAR | Status: DC | PRN

## 2017-02-04 MED ORDER — OXYCODONE-ACETAMINOPHEN 5-325 MG PO TABS
1.0000 | ORAL_TABLET | Freq: Four times a day (QID) | ORAL | Status: DC | PRN
  Administered 2017-02-04 – 2017-02-05 (×4): 2 via ORAL
  Filled 2017-02-04 (×3): qty 2

## 2017-02-04 MED ORDER — THROMBIN (RECOMBINANT) 20000 UNITS EX SOLR
CUTANEOUS | Status: DC | PRN
  Administered 2017-02-04: 20000 [IU] via TOPICAL

## 2017-02-04 MED ORDER — SODIUM CHLORIDE 0.9 % IR SOLN
Status: DC | PRN
  Administered 2017-02-04: 09:00:00

## 2017-02-04 MED ORDER — ACETAMINOPHEN 325 MG PO TABS: 650.0000 mg | ORAL_TABLET | ORAL | Status: DC | PRN

## 2017-02-04 MED ORDER — HYDROMORPHONE HCL 1 MG/ML IJ SOLN
INTRAMUSCULAR | Status: AC
  Administered 2017-02-04: 0.5 mg via INTRAVENOUS
  Filled 2017-02-04: qty 1

## 2017-02-04 MED ORDER — LACTATED RINGERS IV SOLN
INTRAVENOUS | Status: DC | PRN
  Administered 2017-02-04 (×2): via INTRAVENOUS

## 2017-02-04 MED ORDER — FENTANYL CITRATE (PF) 250 MCG/5ML IJ SOLN
INTRAMUSCULAR | Status: AC
  Filled 2017-02-04: qty 5

## 2017-02-04 MED ORDER — MIDAZOLAM HCL 2 MG/2ML IJ SOLN
0.5000 mg | Freq: Once | INTRAMUSCULAR | Status: AC | PRN
  Administered 2017-02-04: 2 mg via INTRAVENOUS

## 2017-02-04 MED ORDER — ONDANSETRON HCL 4 MG PO TABS: 4.0000 mg | ORAL_TABLET | Freq: Four times a day (QID) | ORAL | Status: DC | PRN

## 2017-02-04 MED ORDER — DEXAMETHASONE SODIUM PHOSPHATE 10 MG/ML IJ SOLN
INTRAMUSCULAR | Status: DC | PRN
  Administered 2017-02-04: 10 mg via INTRAVENOUS

## 2017-02-04 MED ORDER — HEMOSTATIC AGENTS (NO CHARGE) OPTIME
TOPICAL | Status: DC | PRN
  Administered 2017-02-04: 1 via TOPICAL

## 2017-02-04 MED ORDER — MIDAZOLAM HCL 2 MG/2ML IJ SOLN
INTRAMUSCULAR | Status: AC
  Administered 2017-02-04: 2 mg via INTRAVENOUS
  Filled 2017-02-04: qty 2

## 2017-02-04 MED ORDER — MIDAZOLAM HCL 2 MG/2ML IJ SOLN
INTRAMUSCULAR | Status: AC
  Filled 2017-02-04: qty 2

## 2017-02-04 MED ORDER — ONDANSETRON HCL 4 MG/2ML IJ SOLN: 4.0000 mg | Freq: Four times a day (QID) | INTRAMUSCULAR | Status: DC | PRN

## 2017-02-04 MED ORDER — SODIUM CHLORIDE 0.9% FLUSH
3.0000 mL | Freq: Two times a day (BID) | INTRAVENOUS | Status: DC
  Administered 2017-02-04 (×2): 3 mL via INTRAVENOUS

## 2017-02-04 MED ORDER — MIDAZOLAM HCL 5 MG/5ML IJ SOLN
INTRAMUSCULAR | Status: DC | PRN
  Administered 2017-02-04: 2 mg via INTRAVENOUS

## 2017-02-04 MED ORDER — SODIUM CHLORIDE 0.9% FLUSH: 3.0000 mL | INTRAVENOUS | Status: DC | PRN

## 2017-02-04 MED ORDER — PROMETHAZINE HCL 25 MG/ML IJ SOLN: 6.2500 mg | INTRAMUSCULAR | Status: DC | PRN

## 2017-02-04 MED ORDER — VANCOMYCIN HCL IN DEXTROSE 1-5 GM/200ML-% IV SOLN
1000.0000 mg | INTRAVENOUS | Status: AC
  Administered 2017-02-04: 1000 mg via INTRAVENOUS
  Filled 2017-02-04: qty 200

## 2017-02-04 MED ORDER — ADULT MULTIVITAMIN W/MINERALS CH: 1.0000 | ORAL_TABLET | Freq: Every day | ORAL | Status: DC

## 2017-02-04 MED ORDER — SUGAMMADEX SODIUM 200 MG/2ML IV SOLN
INTRAVENOUS | Status: DC | PRN
  Administered 2017-02-04: 140 mg via INTRAVENOUS

## 2017-02-04 MED ORDER — CYCLOBENZAPRINE HCL 10 MG PO TABS
10.0000 mg | ORAL_TABLET | Freq: Three times a day (TID) | ORAL | Status: DC | PRN
  Administered 2017-02-04: 10 mg via ORAL

## 2017-02-04 MED ORDER — HYDROMORPHONE HCL 1 MG/ML IJ SOLN
0.2500 mg | INTRAMUSCULAR | Status: DC | PRN
  Administered 2017-02-04 (×4): 0.5 mg via INTRAVENOUS

## 2017-02-04 MED ORDER — VANCOMYCIN HCL IN DEXTROSE 1-5 GM/200ML-% IV SOLN
1000.0000 mg | Freq: Once | INTRAVENOUS | Status: AC
  Administered 2017-02-04: 1000 mg via INTRAVENOUS
  Filled 2017-02-04: qty 200

## 2017-02-04 MED ORDER — PHENYLEPHRINE HCL 10 MG/ML IJ SOLN
INTRAMUSCULAR | Status: DC | PRN
  Administered 2017-02-04: 80 ug via INTRAVENOUS
  Administered 2017-02-04: 120 ug via INTRAVENOUS

## 2017-02-04 MED ORDER — LIDOCAINE 2% (20 MG/ML) 5 ML SYRINGE
INTRAMUSCULAR | Status: AC
  Filled 2017-02-04: qty 5

## 2017-02-04 MED ORDER — PHENYLEPHRINE HCL 10 MG/ML IJ SOLN
INTRAVENOUS | Status: DC | PRN
  Administered 2017-02-04: 30 ug/min via INTRAVENOUS

## 2017-02-04 MED ORDER — PHENOL 1.4 % MT LIQD
1.0000 | OROMUCOSAL | Status: DC | PRN
  Administered 2017-02-04: 1 via OROMUCOSAL
  Filled 2017-02-04: qty 177

## 2017-02-04 MED ORDER — OXYCODONE-ACETAMINOPHEN 5-325 MG PO TABS
ORAL_TABLET | ORAL | Status: AC
  Administered 2017-02-04: 2 via ORAL
  Filled 2017-02-04: qty 2

## 2017-02-04 MED ORDER — ACETAMINOPHEN 650 MG RE SUPP: 650.0000 mg | RECTAL | Status: DC | PRN

## 2017-02-04 MED ORDER — SUGAMMADEX SODIUM 200 MG/2ML IV SOLN
INTRAVENOUS | Status: AC
  Filled 2017-02-04: qty 2

## 2017-02-04 MED ORDER — PROPOFOL 10 MG/ML IV BOLUS
INTRAVENOUS | Status: AC
  Filled 2017-02-04: qty 20

## 2017-02-04 MED ORDER — PROPOFOL 10 MG/ML IV BOLUS
INTRAVENOUS | Status: DC | PRN
  Administered 2017-02-04: 120 mg via INTRAVENOUS
  Administered 2017-02-04: 30 mg via INTRAVENOUS

## 2017-02-04 MED ORDER — FENTANYL CITRATE (PF) 100 MCG/2ML IJ SOLN
INTRAMUSCULAR | Status: DC | PRN
  Administered 2017-02-04 (×2): 50 ug via INTRAVENOUS
  Administered 2017-02-04: 250 ug via INTRAVENOUS
  Administered 2017-02-04 (×5): 50 ug via INTRAVENOUS

## 2017-02-04 MED ORDER — ROCURONIUM BROMIDE 10 MG/ML (PF) SYRINGE
PREFILLED_SYRINGE | INTRAVENOUS | Status: AC
  Filled 2017-02-04: qty 5

## 2017-02-04 MED ORDER — DIAZEPAM 5 MG PO TABS
5.0000 mg | ORAL_TABLET | Freq: Four times a day (QID) | ORAL | Status: DC | PRN
  Administered 2017-02-04 – 2017-02-05 (×4): 5 mg via ORAL
  Filled 2017-02-04 (×4): qty 1

## 2017-02-04 MED ORDER — HYDROCODONE-ACETAMINOPHEN 10-325 MG PO TABS
1.0000 | ORAL_TABLET | ORAL | Status: DC | PRN
  Administered 2017-02-05: 1 via ORAL
  Filled 2017-02-04: qty 1

## 2017-02-04 MED ORDER — HYDROMORPHONE HCL 1 MG/ML IJ SOLN
1.0000 mg | INTRAMUSCULAR | Status: DC | PRN
Start: 2017-02-04 — End: 2017-02-05
  Administered 2017-02-04 (×2): 1 mg via INTRAVENOUS
  Filled 2017-02-04 (×2): qty 1

## 2017-02-04 MED ORDER — ROCURONIUM BROMIDE 100 MG/10ML IV SOLN
INTRAVENOUS | Status: DC | PRN
  Administered 2017-02-04 (×2): 50 mg via INTRAVENOUS

## 2017-02-04 MED ORDER — MENTHOL 3 MG MT LOZG
1.0000 | LOZENGE | OROMUCOSAL | Status: DC | PRN
Start: 2017-02-04 — End: 2017-02-05
  Filled 2017-02-04: qty 9

## 2017-02-04 MED ORDER — CYCLOBENZAPRINE HCL 10 MG PO TABS
ORAL_TABLET | ORAL | Status: AC
  Administered 2017-02-04: 10 mg via ORAL
  Filled 2017-02-04: qty 1

## 2017-02-04 MED ORDER — LIDOCAINE HCL (CARDIAC) 20 MG/ML IV SOLN
INTRAVENOUS | Status: DC | PRN
  Administered 2017-02-04: 30 mg via INTRAVENOUS

## 2017-02-04 MED ORDER — 0.9 % SODIUM CHLORIDE (POUR BTL) OPTIME
TOPICAL | Status: DC | PRN
  Administered 2017-02-04: 1000 mL

## 2017-02-04 MED ORDER — ONDANSETRON HCL 4 MG/2ML IJ SOLN
INTRAMUSCULAR | Status: AC
  Filled 2017-02-04: qty 2

## 2017-02-04 MED ORDER — THROMBIN (RECOMBINANT) 20000 UNITS EX SOLR
CUTANEOUS | Status: AC
  Filled 2017-02-04: qty 20000

## 2017-02-04 MED ORDER — DEXAMETHASONE SODIUM PHOSPHATE 10 MG/ML IJ SOLN
INTRAMUSCULAR | Status: AC
  Filled 2017-02-04: qty 1

## 2017-02-04 SURGICAL SUPPLY — 73 items
ADH SKN CLS APL DERMABOND .7 (GAUZE/BANDAGES/DRESSINGS) ×1
APL SKNCLS STERI-STRIP NONHPOA (GAUZE/BANDAGES/DRESSINGS) ×1
BAG DECANTER FOR FLEXI CONT (MISCELLANEOUS) ×3 IMPLANT
BENZOIN TINCTURE PRP APPL 2/3 (GAUZE/BANDAGES/DRESSINGS) ×3 IMPLANT
BIT DRILL 13 (BIT) ×1 IMPLANT
BIT DRILL 13MM (BIT) ×1
BUR MATCHSTICK NEURO 3.0 LAGG (BURR) ×3 IMPLANT
CAGE PEEK 6X14X11 (Cage) ×12 IMPLANT
CAGE SPNL 11X14X6XRADOPQ (Cage) IMPLANT
CANISTER SUCT 3000ML PPV (MISCELLANEOUS) ×3 IMPLANT
CARTRIDGE OIL MAESTRO DRILL (MISCELLANEOUS) ×1 IMPLANT
CLOSURE WOUND 1/2 X4 (GAUZE/BANDAGES/DRESSINGS) ×1
DERMABOND ADVANCED (GAUZE/BANDAGES/DRESSINGS) ×2
DERMABOND ADVANCED .7 DNX12 (GAUZE/BANDAGES/DRESSINGS) IMPLANT
DIFFUSER DRILL AIR PNEUMATIC (MISCELLANEOUS) ×3 IMPLANT
DRAPE C-ARM 42X72 X-RAY (DRAPES) ×6 IMPLANT
DRAPE LAPAROTOMY 100X72 PEDS (DRAPES) ×3 IMPLANT
DRAPE MICROSCOPE LEICA (MISCELLANEOUS) ×3 IMPLANT
DRAPE POUCH INSTRU U-SHP 10X18 (DRAPES) ×3 IMPLANT
DURAPREP 6ML APPLICATOR 50/CS (WOUND CARE) ×3 IMPLANT
ELECT COATED BLADE 2.86 ST (ELECTRODE) ×3 IMPLANT
ELECT REM PT RETURN 9FT ADLT (ELECTROSURGICAL) ×3
ELECTRODE REM PT RTRN 9FT ADLT (ELECTROSURGICAL) ×1 IMPLANT
GAUZE SPONGE 4X4 12PLY STRL (GAUZE/BANDAGES/DRESSINGS) ×3 IMPLANT
GAUZE SPONGE 4X4 16PLY XRAY LF (GAUZE/BANDAGES/DRESSINGS) ×2 IMPLANT
GLOVE BIO SURGEON STRL SZ8 (GLOVE) ×2 IMPLANT
GLOVE BIOGEL PI IND STRL 6.5 (GLOVE) IMPLANT
GLOVE BIOGEL PI IND STRL 7.0 (GLOVE) IMPLANT
GLOVE BIOGEL PI IND STRL 7.5 (GLOVE) IMPLANT
GLOVE BIOGEL PI IND STRL 8 (GLOVE) IMPLANT
GLOVE BIOGEL PI INDICATOR 6.5 (GLOVE) ×2
GLOVE BIOGEL PI INDICATOR 7.0 (GLOVE) ×2
GLOVE BIOGEL PI INDICATOR 7.5 (GLOVE) ×2
GLOVE BIOGEL PI INDICATOR 8 (GLOVE) ×4
GLOVE ECLIPSE 7.5 STRL STRAW (GLOVE) ×6 IMPLANT
GLOVE ECLIPSE 9.0 STRL (GLOVE) ×3 IMPLANT
GLOVE EXAM NITRILE LRG STRL (GLOVE) IMPLANT
GLOVE EXAM NITRILE XL STR (GLOVE) IMPLANT
GLOVE EXAM NITRILE XS STR PU (GLOVE) IMPLANT
GLOVE SURG SS PI 6.0 STRL IVOR (GLOVE) ×6 IMPLANT
GOWN STRL REUS W/ TWL LRG LVL3 (GOWN DISPOSABLE) IMPLANT
GOWN STRL REUS W/ TWL XL LVL3 (GOWN DISPOSABLE) IMPLANT
GOWN STRL REUS W/TWL 2XL LVL3 (GOWN DISPOSABLE) ×2 IMPLANT
GOWN STRL REUS W/TWL LRG LVL3 (GOWN DISPOSABLE) ×3
GOWN STRL REUS W/TWL XL LVL3 (GOWN DISPOSABLE) ×3
HALTER HD/CHIN CERV TRACTION D (MISCELLANEOUS) ×3 IMPLANT
KIT BASIN OR (CUSTOM PROCEDURE TRAY) ×3 IMPLANT
KIT ROOM TURNOVER OR (KITS) ×3 IMPLANT
NDL SPNL 20GX3.5 QUINCKE YW (NEEDLE) ×1 IMPLANT
NEEDLE SPNL 20GX3.5 QUINCKE YW (NEEDLE) ×3 IMPLANT
NS IRRIG 1000ML POUR BTL (IV SOLUTION) ×3 IMPLANT
OIL CARTRIDGE MAESTRO DRILL (MISCELLANEOUS) ×3
PACK LAMINECTOMY NEURO (CUSTOM PROCEDURE TRAY) ×3 IMPLANT
PAD ARMBOARD 7.5X6 YLW CONV (MISCELLANEOUS) ×9 IMPLANT
PLATE 4 75XNS SPNE CVD ANT T (Plate) IMPLANT
PLATE 4 ATLANTIS TRANS (Plate) ×3 IMPLANT
RUBBERBAND STERILE (MISCELLANEOUS) ×6 IMPLANT
SCREW ST FIX 4 ATL 3120213 (Screw) ×20 IMPLANT
SPACER SPNL 11X14X6XPEEK CVD (Cage) IMPLANT
SPCR SPNL 11X14X6XPEEK CVD (Cage) ×2 IMPLANT
SPONGE INTESTINAL PEANUT (DISPOSABLE) ×3 IMPLANT
SPONGE SURGIFOAM ABS GEL 100 (HEMOSTASIS) ×2 IMPLANT
SPONGE SURGIFOAM ABS GEL SZ50 (HEMOSTASIS) ×1 IMPLANT
STRIP CLOSURE SKIN 1/2X4 (GAUZE/BANDAGES/DRESSINGS) ×2 IMPLANT
SUT VIC AB 3-0 SH 8-18 (SUTURE) ×5 IMPLANT
SUT VIC AB 4-0 RB1 18 (SUTURE) ×3 IMPLANT
TAPE CLOTH 4X10 WHT NS (GAUZE/BANDAGES/DRESSINGS) ×3 IMPLANT
TAPE CLOTH SURG 4X10 WHT LF (GAUZE/BANDAGES/DRESSINGS) ×2 IMPLANT
TOWEL GREEN STERILE (TOWEL DISPOSABLE) ×3 IMPLANT
TOWEL GREEN STERILE FF (TOWEL DISPOSABLE) ×3 IMPLANT
TRAP SPECIMEN MUCOUS 40CC (MISCELLANEOUS) ×3 IMPLANT
TRAY FOLEY W/METER SILVER 16FR (SET/KITS/TRAYS/PACK) ×2 IMPLANT
WATER STERILE IRR 1000ML POUR (IV SOLUTION) ×3 IMPLANT

## 2017-02-04 NOTE — Anesthesia Preprocedure Evaluation (Signed)
Anesthesia Evaluation  Patient identified by MRN, date of birth, ID band Patient awake    Reviewed: Allergy & Precautions, NPO status , Patient's Chart, lab work & pertinent test results  History of Anesthesia Complications Negative for: history of anesthetic complications  Airway Mallampati: II  TM Distance: >3 FB Neck ROM: Full    Dental  (+) Chipped, Poor Dentition, Dental Advisory Given   Pulmonary COPD, Current Smoker,    breath sounds clear to auscultation       Cardiovascular (-) anginanegative cardio ROS   Rhythm:Regular Rate:Normal     Neuro/Psych Chronic back pain: narcotics Cervical stenosis    GI/Hepatic negative GI ROS, Neg liver ROS,   Endo/Other  negative endocrine ROS  Renal/GU negative Renal ROS     Musculoskeletal  (+) Arthritis ,   Abdominal   Peds  Hematology negative hematology ROS (+)   Anesthesia Other Findings   Reproductive/Obstetrics                             Anesthesia Physical Anesthesia Plan  ASA: III  Anesthesia Plan: General   Post-op Pain Management:    Induction: Intravenous  PONV Risk Score and Plan: 3 and Ondansetron, Dexamethasone and Midazolam  Airway Management Planned: Oral ETT and Video Laryngoscope Planned  Additional Equipment:   Intra-op Plan:   Post-operative Plan: Extubation in OR  Informed Consent: I have reviewed the patients History and Physical, chart, labs and discussed the procedure including the risks, benefits and alternatives for the proposed anesthesia with the patient or authorized representative who has indicated his/her understanding and acceptance.   Dental advisory given  Plan Discussed with: CRNA and Surgeon  Anesthesia Plan Comments: (Plan routine monitors, GETA with VideoGlide intubation)        Anesthesia Quick Evaluation

## 2017-02-04 NOTE — Transfer of Care (Signed)
Immediate Anesthesia Transfer of Care Note  Patient: Deborah Hoffman  Procedure(s) Performed: Cervical three-four, Cervicval four-five, Cervical five-six, Cervical six-seven Anterior cervical decompression/discectomy/fusion (N/A Neck)  Patient Location: PACU  Anesthesia Type:General  Level of Consciousness: awake, alert  and oriented  Airway & Oxygen Therapy: Patient Spontanous Breathing  Post-op Assessment: Report given to RN and Post -op Vital signs reviewed and stable  Post vital signs: Reviewed and stable  Last Vitals:  Vitals:   02/04/17 0550 02/04/17 0554  BP: 127/85   Pulse: 77   Resp: 18   Temp:  36.5 C  SpO2: 99%     Last Pain:  Vitals:   02/04/17 0554  TempSrc: Oral  PainSc:       Patients Stated Pain Goal: 4 (02/04/17 0550)  Complications: No apparent anesthesia complications

## 2017-02-04 NOTE — Brief Op Note (Signed)
02/04/2017  10:12 AM  PATIENT:  Milana Kidney  51 y.o. female  PRE-OPERATIVE DIAGNOSIS:  Spinal stenosis, Cervical region  POST-OPERATIVE DIAGNOSIS:  Spinal stenosis, Cervical region  PROCEDURE:  Procedure(s): Cervical three-four, Cervicval four-five, Cervical five-six, Cervical six-seven Anterior cervical decompression/discectomy/fusion (N/A)  SURGEON:  Surgeon(s) and Role:    * Julio Sicks, MD - Primary    * Tia Alert, MD - Assisting  PHYSICIAN ASSISTANT:   ASSISTANTS:    ANESTHESIA:   general  EBL:  150 mL   BLOOD ADMINISTERED:none  DRAINS: none   LOCAL MEDICATIONS USED:  NONE  SPECIMEN:  No Specimen  DISPOSITION OF SPECIMEN:  N/A  COUNTS:  YES  TOURNIQUET:  * No tourniquets in log *  DICTATION: .Dragon Dictation  PLAN OF CARE: Admit to inpatient   PATIENT DISPOSITION:  PACU - hemodynamically stable.   Delay start of Pharmacological VTE agent (>24hrs) due to surgical blood loss or risk of bleeding: yes

## 2017-02-04 NOTE — Anesthesia Procedure Notes (Signed)
Procedure Name: Intubation Date/Time: 02/04/2017 7:48 AM Performed by: Rosiland Oz Pre-anesthesia Checklist: Patient identified, Emergency Drugs available, Suction available, Patient being monitored and Timeout performed Patient Re-evaluated:Patient Re-evaluated prior to induction Oxygen Delivery Method: Circle system utilized Preoxygenation: Pre-oxygenation with 100% oxygen Induction Type: IV induction Ventilation: Mask ventilation without difficulty Laryngoscope Size: Glidescope and 4 Grade View: Grade I Tube type: Oral Tube size: 7.0 mm Number of attempts: 1 Airway Equipment and Method: Stylet Placement Confirmation: ETT inserted through vocal cords under direct vision,  positive ETCO2 and breath sounds checked- equal and bilateral Secured at: 21 cm Tube secured with: Tape Dental Injury: Teeth and Oropharynx as per pre-operative assessment

## 2017-02-04 NOTE — Progress Notes (Signed)
Pharmacy Antibiotic Note  Deborah Hoffman is a 51 y.o. female admitted on 02/04/2017 with surgical prophylaxis.  Pharmacy has been consulted for vancomycin dosing.  Patient received pre-op vancomycin 1g at 0800. CrCl ~70-36mL/min. No drain in place.  Plan: Vancomycin 1g IV x1 at 2000 tonight Pharmacy to sign off as no future doses required  Height: 5\' 1"  (154.9 cm) Weight: 153 lb (69.4 kg) IBW/kg (Calculated) : 47.8  Temp (24hrs), Avg:98.2 F (36.8 C), Min:97.7 F (36.5 C), Max:98.8 F (37.1 C)  No results for input(s): WBC, CREATININE, LATICACIDVEN, VANCOTROUGH, VANCOPEAK, VANCORANDOM, GENTTROUGH, GENTPEAK, GENTRANDOM, TOBRATROUGH, TOBRAPEAK, TOBRARND, AMIKACINPEAK, AMIKACINTROU, AMIKACIN in the last 168 hours.  Estimated Creatinine Clearance: 73.2 mL/min (by C-G formula based on SCr of 0.7 mg/dL).    Allergies  Allergen Reactions  . Ambien [Zolpidem] Other (See Comments)    Hallucinations.  . Benadryl [Diphenhydramine Hcl (Sleep)] Other (See Comments)    hallucinations  . Neurontin [Gabapentin] Other (See Comments)    "made me jerk really bad"  . Nyquil Multi-Symptom [Pseudoeph-Doxylamine-Dm-Apap] Other (See Comments)    hallucinations  . Paxil [Paroxetine Hcl] Other (See Comments)    hallucinations   . Penicillins Other (See Comments)    Family allergy. Has patient had a PCN reaction causing immediate rash, facial/tongue/throat swelling, SOB or lightheadedness with hypotension:No Has patient had a PCN reaction causing severe rash involving mucus membranes or skin necrosis: No Has patient had a PCN reaction that required hospitalization:No Has patient had a PCN reaction occurring within the last 10 years:No If all of the above answers are "NO", then may proceed with Cephalosporin use.   . Sulfur Other (See Comments)    Childhood allergy.  . Duragesic-100 [Fentanyl] Rash    Patch ONLY    Thank you for allowing pharmacy to be a part of this patient's care.  Deborah Hoffman,  Deborah Hoffman 02/04/2017 1:20 PM

## 2017-02-04 NOTE — Anesthesia Postprocedure Evaluation (Signed)
Anesthesia Post Note  Patient: Deborah Hoffman  Procedure(s) Performed: Cervical three-four, Cervicval four-five, Cervical five-six, Cervical six-seven Anterior cervical decompression/discectomy/fusion (N/A Neck)     Patient location during evaluation: PACU Anesthesia Type: General Level of consciousness: awake and alert, oriented and patient cooperative Pain management: pain level controlled (pain improving) Vital Signs Assessment: post-procedure vital signs reviewed and stable Respiratory status: spontaneous breathing, nonlabored ventilation, respiratory function stable and patient connected to nasal cannula oxygen Cardiovascular status: blood pressure returned to baseline and stable Postop Assessment: no apparent nausea or vomiting Anesthetic complications: no    Last Vitals:  Vitals:   02/04/17 0554 02/04/17 1022  BP:  (!) 158/76  Pulse:  82  Resp:  14  Temp: 36.5 C 36.7 C  SpO2:  99%    Last Pain:  Vitals:   02/04/17 1022  TempSrc:   PainSc: 10-Worst pain ever                 Masato Pettie,E. [place]

## 2017-02-04 NOTE — H&P (Signed)
Deborah Hoffman is an 51 y.o. female.   Chief Complaint: Neck pain HPI: 51 year old female with severe neck pain with bilateral upper extremity numbness paresthesias and weakness.  Workup demonstrates evidence of severe multilevel cervical disc degeneration with associated spondylosis and severe stenosis.  Patient presents now for anterior cervical decompression and fusion.  Past Medical History:  Diagnosis Date  . Arthritis   . Heart murmur 01/27/2017   pt has been told that she has a "murmur or flutter" all her life, asymptomatic  . Rheumatoid arthritis (HCC) 01/27/2017    Past Surgical History:  Procedure Laterality Date  . ABDOMINAL SURGERY    . BACK SURGERY    . LAPAROSCOPIC SALPINGO OOPHERECTOMY    . TONSILLECTOMY      History reviewed. No pertinent family history. Social History:  reports that she has been smoking Cigarettes.  She has a 25.00 pack-year smoking history. She has never used smokeless tobacco. She reports that she drinks alcohol. She reports that she does not use drugs.  Allergies:  Allergies  Allergen Reactions  . Ambien [Zolpidem] Other (See Comments)    Hallucinations.  . Benadryl [Diphenhydramine Hcl (Sleep)] Other (See Comments)    hallucinations  . Neurontin [Gabapentin] Other (See Comments)    "made me jerk really bad"  . Nyquil Multi-Symptom [Pseudoeph-Doxylamine-Dm-Apap] Other (See Comments)    hallucinations  . Paxil [Paroxetine Hcl] Other (See Comments)    hallucinations   . Penicillins Other (See Comments)    Family allergy. Has patient had a PCN reaction causing immediate rash, facial/tongue/throat swelling, SOB or lightheadedness with hypotension:No Has patient had a PCN reaction causing severe rash involving mucus membranes or skin necrosis: No Has patient had a PCN reaction that required hospitalization:No Has patient had a PCN reaction occurring within the last 10 years:No If all of the above answers are "NO", then may proceed with  Cephalosporin use.   . Sulfur Other (See Comments)    Childhood allergy.  . Duragesic-100 [Fentanyl] Rash    Patch ONLY    Medications Prior to Admission  Medication Sig Dispense Refill  . acetaminophen (NON-ASPIRIN) 325 MG tablet Take 650-975 mg by mouth 3 (three) times daily as needed (for back pain).    . Multiple Vitamins-Minerals (ONE-A-DAY MENOPAUSE FORMULA PO) Take 1 tablet by mouth daily.    Marland Kitchen oxyCODONE-acetaminophen (PERCOCET/ROXICET) 5-325 MG tablet Take 1-2 tablets by mouth every 6 (six) hours as needed for severe pain. 15 tablet 0  . cyclobenzaprine (FLEXERIL) 10 MG tablet Take 1 tablet (10 mg total) by mouth 2 (two) times daily as needed for muscle spasms. (Patient not taking: Reported on 01/26/2017) 20 tablet 0    No results found for this or any previous visit (from the past 48 hour(s)). No results found.  Pertinent items noted in HPI and remainder of comprehensive ROS otherwise negative.  Blood pressure 127/85, pulse 77, temperature 97.7 F (36.5 C), temperature source Oral, resp. rate 18, height 5\' 1"  (1.549 m), weight 69.4 kg (153 lb), last menstrual period 05/18/2014, SpO2 99 %.  Awake and alert.  Oriented and appropriate.  Speech fluent.  Judgment and insight intact.  Motor examination reveals moderate weakness of her left twin muscle group.  She has some weakness of her biceps on the left.  Grip strength is diminished bilaterally.  Intrinsic strength diminished bilaterally.  Lower extremity strength mildly weak with spasticity.  Sensory loss distally in both upper extremities.  Hoffmann's response in both hands.  Toes upgoing to plantar stimulation.  Reflexes increased.  Examination head ears eyes nose throat is unremarkable her chest and abdomen benign.  Extremities Free from anterior deformity. Assessment/Plan Cervical stenosis with myelopathy.  Plan C3-4, C4-5, C5-6, C6-7 anterior cervical discectomy and fusion with anterior instrumentation.  Risks and benefits were  explained.  Patient wishes to proceed.  Kindal Ponti A 02/04/2017, 7:35 AM

## 2017-02-04 NOTE — Op Note (Signed)
Date of procedure: 02/04/2017  Date of dictation: Same  Service: Neurosurgery  Preoperative diagnosis: Cervical stenosis with myelopathy   Postoperative diagnosis: Same  Procedure Name: C3-4, C4-5, C5-6, C6-7 anterior cervical discectomy with interbody fusion utilizing interbody peek cages, locally harvested autograft, and anterior plate instrumentation  Surgeon:Illa Enlow A.Jedi Catalfamo, M.D.  Asst. Surgeon: Yetta Barre  Anesthesia: General  Indication: 51 year old female with neck and bilateral upper extremity symptoms consistent with progressive cervical myelopathy.  Workup demonstrates evidence of marked multilevel spondylosis with stenosis and cord compression.  Patient presents now for multilevel anterior cervical decompression and fusion.  Operative note: After induction of anesthesia, patient position supine with neck slightly extended and held in place and halter traction.  Anterior cervical region prepped and draped sterilely.  Incision made overlying C5.  Dissection performed on the right.  Retractor placed.  Fluoroscopy used.  Levels confirmed.  Disc spaces incised at all 4 levels.  Discectomy was then performed using various instruments down to the level of the posterior annulus.  Microscope brought field used throughout the remainder of the discectomy.  Starting first at C3-4 remaining aspects of annulus and osteophytes were removed using high-speed drill down to over the posterior longitudinal ligament.  Posterior nodule was elevated and resected piecemeal fashion.  Underlying thecal sac was then identified.  Wide central decompression was then performed by undercutting the bodies of C3 and C4.  Decompression then proceeded into each neural foramen.  Wide anterior foraminotomies were performed on the course exiting C4 nerve roots bilaterally.  At this point a very thorough decompression had been achieved.  There was no evidence of injury to the physical sac or nerve roots.  Procedure was then repeated  at C4-5, C5-6 and C6-7.  All levels were irrigated.  Hemostasis was ensured.  [employer]onic anatomic peek cages packed with locally harvested allograft were impacted at all 4 levels.  Each cage was then recessed slightly from the anterior cortical margin.  Medtronic Atlantis translational plate was then placed over the C3-C7 levels.  This then attached under fluoroscopic guidance using 13 mm fixed angle screws to each at all 5 levels.  All screws and final tightening found to be solidly within the bone.  Locking screws engaged at all 5 levels.  Final images reveal good position of the cages and hardware at the proper level with normal alignment of the spine.  Wound is irrigated one final time.  Hemostasis was assured.  Wound was then closed in layers of Vicryl sutures.  Steri-Strips and sterile dressing were applied.  There were no apparent complications.  The patient tolerated the procedure well and she returns to the recovery room postop.

## 2017-02-05 ENCOUNTER — Encounter (HOSPITAL_COMMUNITY): Payer: Self-pay | Admitting: Neurosurgery

## 2017-02-05 MED ORDER — OXYCODONE-ACETAMINOPHEN 5-325 MG PO TABS
1.0000 | ORAL_TABLET | ORAL | Status: DC | PRN
  Administered 2017-02-05: 2 via ORAL
  Filled 2017-02-05: qty 2

## 2017-02-05 MED ORDER — DIAZEPAM 5 MG PO TABS: 5.0000 mg | ORAL_TABLET | Freq: Four times a day (QID) | ORAL | 0 refills | Status: AC | PRN

## 2017-02-05 MED ORDER — OXYCODONE-ACETAMINOPHEN 5-325 MG PO TABS: 1.0000 | ORAL_TABLET | Freq: Four times a day (QID) | ORAL | 0 refills | Status: DC | PRN

## 2017-02-05 NOTE — Care Management Note (Signed)
Case Management Note  Patient Details  Name: Deborah Hoffman MRN: [id number] Date of Birth: Jan 02, 1966  Subjective/Objective:     Pt is s/p cervical decompression fusion surgery              Action/Plan:  PTA independent from home with husband.  Pt does not have PCP - CM provided [facility] information on AVS. Pt states she can afford prescribed medications - CM also provided coupons for prescribed medications.    Expected Discharge Date:  02/05/17               Expected Discharge Plan:  Home/Self Care  In-House Referral:     Discharge planning Services  CM Consult  Post Acute Care Choice:    Choice offered to:     DME Arranged:    DME Agency:     HH Arranged:    HH Agency:     Status of Service:  Completed, signed off  If discussed at [employer] of Stay Meetings, dates discussed:    Additional Comments:  Cherylann Parr, RN 02/05/2017, 11:22 AM

## 2017-02-05 NOTE — Discharge Summary (Signed)
Physician Discharge Summary  Patient ID: Deborah Hoffman MRN: [id number] DOB/AGE: 09-20-1965 51 y.o.  Admit date: 02/04/2017 Discharge date: 02/05/2017  Admission Diagnoses:  Discharge Diagnoses:  Active Problems:   Cervical spinal stenosis   Discharged Condition: good  Hospital Course: Patient admitted to the hospital where she underwent a 4 level anterior cervical decompression and fusion.  Postoperatively doing well.  Preoperative severe neck and bilateral upper extremity symptoms much improved.  Swallowing well.  No issues with neck swelling.  Patient feels ready for discharge home.  Consults:   Significant Diagnostic Studies:   Treatments:   Discharge Exam: Blood pressure (!) 108/57, pulse 75, temperature 98.4 F (36.9 C), temperature source Oral, resp. rate 20, height 5\' 1"  (1.549 m), weight 69.4 kg (153 lb), last menstrual period 05/18/2014, SpO2 98 %. Awake and alert.  Oriented and appropriate.  Cranial nerve function intact.  Motor and sensory function extremities normal.  Wound clean and dry.  Chest and abdomen benign.  Disposition: 01-Home or Self Care   Allergies as of 02/05/2017      Reactions   Ambien [zolpidem] Other (See Comments)   Hallucinations.   Benadryl [diphenhydramine Hcl (sleep)] Other (See Comments)   hallucinations   Neurontin [gabapentin] Other (See Comments)   "made me jerk really bad"   Nyquil Multi-symptom [pseudoeph-doxylamine-dm-apap] Other (See Comments)   hallucinations   Paxil [paroxetine Hcl] Other (See Comments)   hallucinations    Penicillins Other (See Comments)   Family allergy. Has patient had a PCN reaction causing immediate rash, facial/tongue/throat swelling, SOB or lightheadedness with hypotension:No Has patient had a PCN reaction causing severe rash involving mucus membranes or skin necrosis: No Has patient had a PCN reaction that required hospitalization:No Has patient had a PCN reaction occurring within the last 10  years:No If all of the above answers are "NO", then may proceed with Cephalosporin use.   Sulfur Other (See Comments)   Childhood allergy.   Duragesic-100 [fentanyl] Rash   Patch ONLY      Medication List    TAKE these medications   cyclobenzaprine 10 MG tablet Commonly known as:  FLEXERIL Take 1 tablet (10 mg total) by mouth 2 (two) times daily as needed for muscle spasms.   diazepam 5 MG tablet Commonly known as:  VALIUM Take 1 tablet (5 mg total) by mouth every 6 (six) hours as needed for muscle spasms.   NON-ASPIRIN 325 MG tablet Generic drug:  acetaminophen Take 650-975 mg by mouth 3 (three) times daily as needed (for back pain).   ONE-A-DAY MENOPAUSE FORMULA PO Take 1 tablet by mouth daily.   oxyCODONE-acetaminophen 5-325 MG tablet Commonly known as:  PERCOCET/ROXICET Take 1-2 tablets by mouth every 6 (six) hours as needed for severe pain.      Follow-up Information    [facility] Follow up.   Why:  Please call or vist to establish appointment with primary care physician Contact information: [address] [place], [place]  [phone]          Signed: [phone] 02/05/2017, 10:25 AM

## 2017-02-05 NOTE — Progress Notes (Signed)
Patient alert and oriented, mae's well, voiding adequate amount of urine, swallowing without difficulty,  c/o mild pain at time of discharge. Patient discharged home with family. Script and discharged instructions given to patient. Patient and family stated understanding of instructions given. Patient has an appointment with Dr. Pool.  

## 2017-02-05 NOTE — Discharge Instructions (Signed)

## 2017-05-27 ENCOUNTER — Other Ambulatory Visit: Payer: Self-pay | Admitting: Neurosurgery

## 2017-05-27 DIAGNOSIS — M4802 Spinal stenosis, cervical region: Secondary | ICD-10-CM

## 2017-06-09 ENCOUNTER — Ambulatory Visit
Admission: RE | Admit: 2017-06-09 | Discharge: 2017-06-09 | Disposition: A | Discharge status: Home / Self Care | Payer: No Typology Code available for payment source | Source: Ambulatory Visit | Attending: Neurosurgery | Admitting: Neurosurgery

## 2017-06-09 DIAGNOSIS — M4802 Spinal stenosis, cervical region: Secondary | ICD-10-CM

## 2017-06-17 ENCOUNTER — Other Ambulatory Visit: Payer: Self-pay | Admitting: Neurosurgery

## 2017-06-17 DIAGNOSIS — M4802 Spinal stenosis, cervical region: Secondary | ICD-10-CM

## 2017-06-25 ENCOUNTER — Ambulatory Visit
Admission: RE | Admit: 2017-06-25 | Discharge: 2017-06-25 | Disposition: A | Discharge status: Home / Self Care | Payer: Self-pay | Source: Ambulatory Visit | Attending: Neurosurgery | Admitting: Neurosurgery

## 2017-06-25 DIAGNOSIS — M4802 Spinal stenosis, cervical region: Secondary | ICD-10-CM

## 2017-06-25 MED ORDER — GADOBENATE DIMEGLUMINE 529 MG/ML IV SOLN
13.0000 mL | Freq: Once | INTRAVENOUS | Status: AC | PRN
  Administered 2017-06-25: 13 mL via INTRAVENOUS

## 2017-08-16 ENCOUNTER — Ambulatory Visit: Payer: PRIVATE HEALTH INSURANCE | Admitting: Diagnostic Neuroimaging

## 2017-08-16 ENCOUNTER — Encounter: Payer: Self-pay | Admitting: Diagnostic Neuroimaging

## 2017-08-16 VITALS — BP 134/88 | HR 93 | Ht 61.0 in | Wt 137.6 lb

## 2017-08-16 DIAGNOSIS — G8928 Other chronic postprocedural pain: Secondary | ICD-10-CM

## 2017-08-16 DIAGNOSIS — R51 Headache: Secondary | ICD-10-CM | POA: Diagnosis not present

## 2017-08-16 DIAGNOSIS — R42 Dizziness and giddiness: Secondary | ICD-10-CM

## 2017-08-16 DIAGNOSIS — G4486 Cervicogenic headache: Secondary | ICD-10-CM

## 2017-08-16 NOTE — Patient Instructions (Signed)
-   consider vestibular PT  - continue pain mgmt clinic referral  - consider psychology/psychiatry referral re: depression / chronic pain  - improve nutrition / exercise over time

## 2017-08-16 NOTE — Progress Notes (Signed)
[facility]  PATIENT: Deborah Hoffman DOB: 05/30/1965  REFERRING CLINICIAN: Elias Else, MD HISTORY FROM: patient, husband, son, chart review  REASON FOR VISIT: new consult    HISTORICAL  CHIEF COMPLAINT:  Chief Complaint  Patient presents with  . NP Dr. Jordan Likes,  . Dizziness and headaches R. head neck numbness    Has vertigo, nystagmus since surgery with headaches, numbness r side head and neck.  Taking only tylenol now, states was not told would be off pain med.     HISTORY OF PRESENT ILLNESS:   52 year old female here for evaluation of neck pain, headache, vertigo, dizziness.  November 2018 patient underwent multilevel cervical discectomy and fusion due to cervical spinal stenosis.  Prior to surgery patient was having pain radiating to the right shoulder.  Unfortunately following surgery patient has had significant pain between her shoulder blades, back of neck, head, associate with increasing vertigo and nausea.  Patient had MRI of the brain and cervical spine which found no acute findings.  Patient has had history of intermittent positional vertigo in the past going back at least 2 to 3 years.  This was treated conservatively with vestibular therapy and repositioning maneuvers.  Patient feels extremely frustrated, is tearful, and feels hopeless.  Patient was on narcotic pain management postoperatively but has been weaned off.  Patient feels like pain medicines were only mildly helping, but now pain is significantly worse.  Patient is concerned about "osteoarthritis, osteoporosis, rheumatoid arthritis, cervical spinal stenosis, degenerative spine disease".  Patient tells me that she was told she would need multiple surgeries in the future, but now is not sure if that is what is recommended.  Patient feels significant pain in between her shoulder blades and feels that there is "something wrong" and is frustrated that no one has taken a x-ray of this  region.    REVIEW OF SYSTEMS: Full 14 system review of systems performed and negative with exception of: Insomnia snoring restless legs dizziness numbness weakness headache memory loss amount of sleep decreased energy change appetite joint pain joint swelling feeling hot feeling cold blurred vision chills weight loss fatigue spinning sensation.  ALLERGIES: Allergies  Allergen Reactions  . Ambien [Zolpidem] Other (See Comments)    Hallucinations.  . Benadryl [Diphenhydramine Hcl (Sleep)] Other (See Comments)    hallucinations  . Neurontin [Gabapentin] Other (See Comments)    "made me jerk really bad"  . Nyquil Multi-Symptom [Pseudoeph-Doxylamine-Dm-Apap] Other (See Comments)    hallucinations  . Paxil [Paroxetine Hcl] Other (See Comments)    hallucinations   . Penicillins Other (See Comments)    Family allergy. Has patient had a PCN reaction causing immediate rash, facial/tongue/throat swelling, SOB or lightheadedness with hypotension:No Has patient had a PCN reaction causing severe rash involving mucus membranes or skin necrosis: No Has patient had a PCN reaction that required hospitalization:No Has patient had a PCN reaction occurring within the last 10 years:No If all of the above answers are "NO", then may proceed with Cephalosporin use.   . Sulfur Other (See Comments)    Childhood allergy.  . Duragesic-100 [Fentanyl] Rash    Patch ONLY    HOME MEDICATIONS: Outpatient Medications Prior to Visit  Medication Sig Dispense Refill  . acetaminophen (NON-ASPIRIN) 325 MG tablet Take 650-975 mg by mouth 3 (three) times daily as needed (for back pain).    . cyclobenzaprine (FLEXERIL) 10 MG tablet Take 1 tablet (10 mg total) by mouth 2 (two) times daily as needed for muscle  spasms. (Patient not taking: Reported on 01/26/2017) 20 tablet 0  . diazepam (VALIUM) 5 MG tablet Take 1 tablet (5 mg total) by mouth every 6 (six) hours as needed for muscle spasms. (Patient not taking: Reported on  08/16/2017) 30 tablet 0  . Multiple Vitamins-Minerals (ONE-A-DAY MENOPAUSE FORMULA PO) Take 1 tablet by mouth daily.    Marland Kitchen oxyCODONE-acetaminophen (PERCOCET/ROXICET) 5-325 MG tablet Take 1-2 tablets by mouth every 6 (six) hours as needed for severe pain. (Patient not taking: Reported on 08/16/2017) 50 tablet 0   No facility-administered medications prior to visit.     PAST MEDICAL HISTORY: Past Medical History:  Diagnosis Date  . Arthritis   . DDD (degenerative disc disease), cervical   . Heart murmur 01/27/2017   pt has been told that she has a "murmur or flutter" all her life, asymptomatic  . Rheumatoid arthritis (HCC) 01/27/2017  . Rheumatoid arthritis (HCC)   . Spinal stenosis     PAST SURGICAL HISTORY: Past Surgical History:  Procedure Laterality Date  . ABDOMINAL SURGERY    . ANTERIOR CERVICAL DECOMPRESSION/DISCECTOMY FUSION 4 LEVELS N/A 02/04/2017   Procedure: Cervical three-four, Cervicval four-five, Cervical five-six, Cervical six-seven Anterior cervical decompression/discectomy/fusion;  Surgeon: Julio Sicks, MD;  Location: [facility] OR;  Service: Neurosurgery;  Laterality: N/A;  . BACK SURGERY     2001-2002 micro discectomy 2003 reconsturctive back surgery.   Marland Kitchen LAPAROSCOPIC SALPINGO OOPHERECTOMY    . TONSILLECTOMY      FAMILY HISTORY: Family History  Problem Relation Age of Onset  . Arthritis Mother     SOCIAL HISTORY:  Social History   Socioeconomic History  . Marital status: Married    Spouse name: Not on file  . Number of children: Not on file  . Years of education: Not on file  . Highest education level: Not on file  Occupational History  . Not on file  Social Needs  . Financial resource strain: Not on file  . Food insecurity:    Worry: Not on file    Inability: Not on file  . Transportation needs:    Medical: Not on file    Non-medical: Not on file  Tobacco Use  . Smoking status: Current Every Day Smoker    Packs/day: 1.00    Years: 25.00    Pack  years: 25.00    Types: Cigarettes  . Smokeless tobacco: Never Used  Substance and Sexual Activity  . Alcohol use: Yes    Comment: occasional  . Drug use: No  . Sexual activity: Yes    Birth control/protection: None  Lifestyle  . Physical activity:    Days per week: Not on file    Minutes per session: Not on file  . Stress: Not on file  Relationships  . Social connections:    Talks on phone: Not on file    Gets together: Not on file    Attends religious service: Not on file    Active member of club or organization: Not on file    Attends meetings of clubs or organizations: Not on file    Relationship status: Not on file  . Intimate partner violence:    Fear of current or ex partner: Not on file    Emotionally abused: Not on file    Physically abused: Not on file    Forced sexual activity: Not on file  Other Topics Concern  . Not on file  Social History Narrative   Lives at home with husband, Brett Canales and son Viviann Spare.  Education GED. 3 children.       PHYSICAL EXAM  GENERAL EXAM/CONSTITUTIONAL: Vitals:  Vitals:   08/16/17 1429  BP: 134/88  Pulse: 93  Weight: 137 lb 9.6 oz (62.4 kg)  Height: 5\' 1"  (1.549 m)     Body mass index is 26 kg/m.  No exam data present  Patient is in no distress; well developed, nourished and groomed; neck is supple  TEARFUL, DEPRESSION, FRUSTRATED  SMELL OF CIGARETTE SMOKE  CARDIOVASCULAR:  Examination of carotid arteries is normal; no carotid bruits  Regular rate and rhythm, no murmurs  Examination of peripheral vascular system by observation and palpation is normal  EYES:  Ophthalmoscopic exam of optic discs and posterior segments is normal; no papilledema or hemorrhages  MUSCULOSKELETAL:  Gait, strength, tone, movements noted in Neurologic exam below  NEUROLOGIC: MENTAL STATUS:  No flowsheet data found.  awake, alert, oriented to person, place and time  recent and remote memory intact  normal attention and  concentration  language fluent, comprehension intact, naming intact,   fund of knowledge appropriate  CRANIAL NERVE:   2nd - no papilledema on fundoscopic exam  2nd, 3rd, 4th, 6th - pupils equal and reactive to light, visual fields full to confrontation, extraocular muscles intact, no nystagmus  5th - facial sensation symmetric  7th - facial strength symmetric  8th - hearing intact  9th - palate elevates symmetrically, uvula midline  11th - shoulder shrug symmetric  12th - tongue protrusion midline  MOTOR:   normal bulk and tone, full strength in the BUE, BLE  SENSORY:   normal and symmetric to light touch, temperature, vibration; EXCEPT DECR IN RIGHT FOOT  COORDINATION:   finger-nose-finger, fine finger movements normal  REFLEXES:   deep tendon reflexes TRACE and symmetric  GAIT/STATION:   narrow based gait; ANTALGIC GAIT    DIAGNOSTIC DATA (LABS, IMAGING, TESTING) - I reviewed patient records, labs, notes, testing and imaging myself where available.  Lab Results  Component Value Date   WBC 7.1 01/27/2017   HGB 16.0 (H) 01/27/2017   HCT 46.7 (H) 01/27/2017   MCV 94.2 01/27/2017   PLT 227 01/27/2017      Component Value Date/Time   NA 136 01/27/2017 0825   K 3.9 01/27/2017 0825   CL 104 01/27/2017 0825   CO2 21 (L) 01/27/2017 0825   GLUCOSE 109 (H) 01/27/2017 0825   BUN 13 01/27/2017 0825   CREATININE 0.70 01/27/2017 0825   CALCIUM 9.5 01/27/2017 0825   PROT 8.3 (H) 09/17/2015 1725   ALBUMIN 4.8 09/17/2015 1725   AST 29 09/17/2015 1725   ALT 26 09/17/2015 1725   ALKPHOS 96 09/17/2015 1725   BILITOT 0.8 09/17/2015 1725   GFRNONAA >60 01/27/2017 0825   GFRAA >60 01/27/2017 0825   No results found for: CHOL, HDL, LDLCALC, LDLDIRECT, TRIG, CHOLHDL No results found for: SEGB1D No results found for: VITAMINB12 No results found for: TSH   06/25/17 MRI BRAIN [I reviewed images myself and agree with interpretation. -VRP]  - Normal examination.  No cause of the presenting symptoms is identified. No change since the study of 2016.  06/09/17 MRI cervical spine [I reviewed images myself and agree with interpretation. -VRP]  - Good appearance in the fusion segment from C3 through C7. No adjacent segment abnormality seen at C2-3 or C7-T1. There is ordinary osteoarthritis at the C1-2 articulation.     ASSESSMENT AND PLAN  53 y.o. year old female here with:  Dx: post-op pain (neck, arm,  upper thoracic) / vertigo / headaches   1. Chronic post-operative pain   2. Dizziness   3. Cervicogenic headache      PLAN:  - consider vestibular PT - continue pain mgmt clinic referral - consider psychology/psychiatry referral re: depression/anxiety - improve nutrition / exercise / smoking cessation - all of patient's questions and concerns were addressed at this visit  Return if symptoms worsen or fail to improve, for return to PCP and neurosurgery.    Suanne Marker, MD 08/16/2017, 2:47 PM Certified in Neurology, Neurophysiology and Neuroimaging  [facility] [address], [address] [place], [place] [address] [phone]

## 2018-07-26 IMAGING — RF DG C-ARM 61-120 MIN
1 series · 2 of 2 positions shown · non-contrast
Comparison: MRI 11/02/2016 .

CLINICAL DATA: Cervical spine surgery. Prior C3 through C7 fusion .

EXAM:
DG C-ARM 61-120 MIN; CERVICAL SPINE - 2-3 VIEW

[Series 1: run · 2 of 2 slices shown]
[im 1/2]
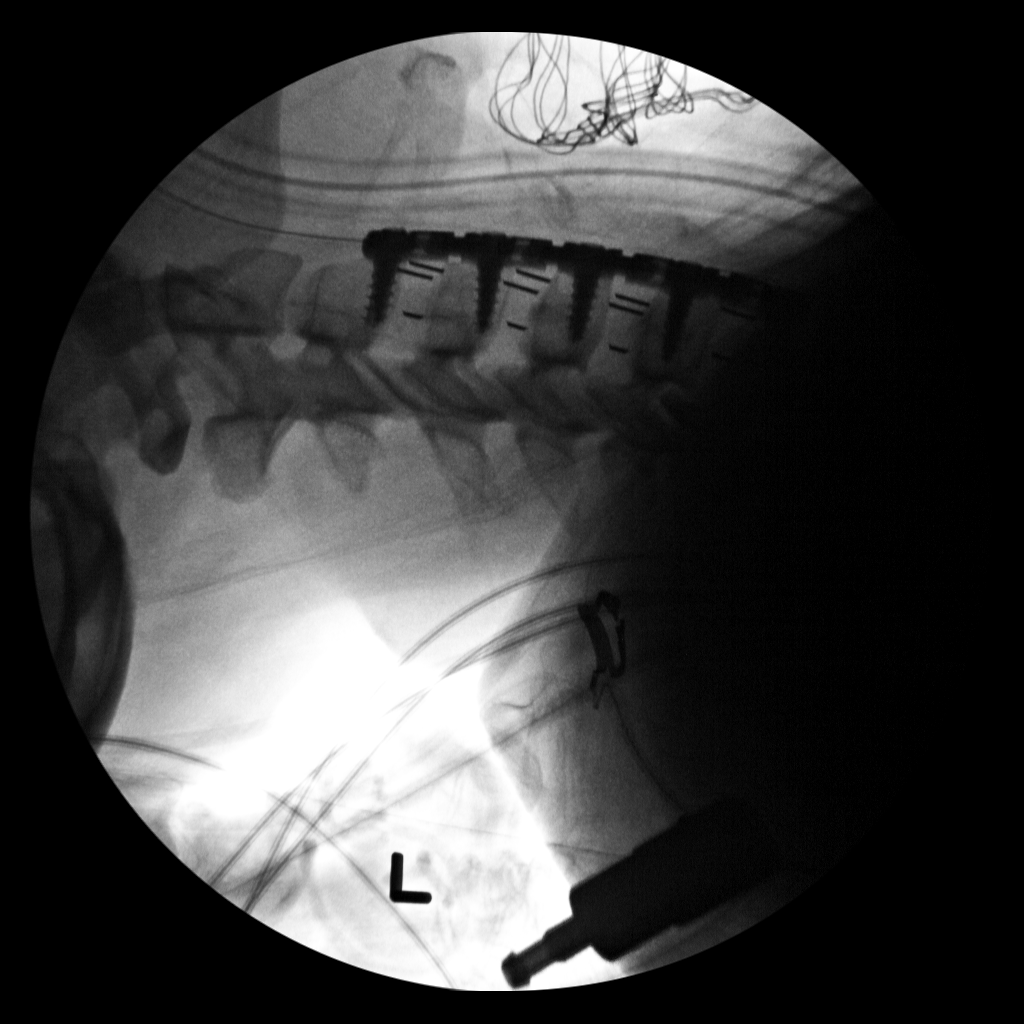
[im 2/2]
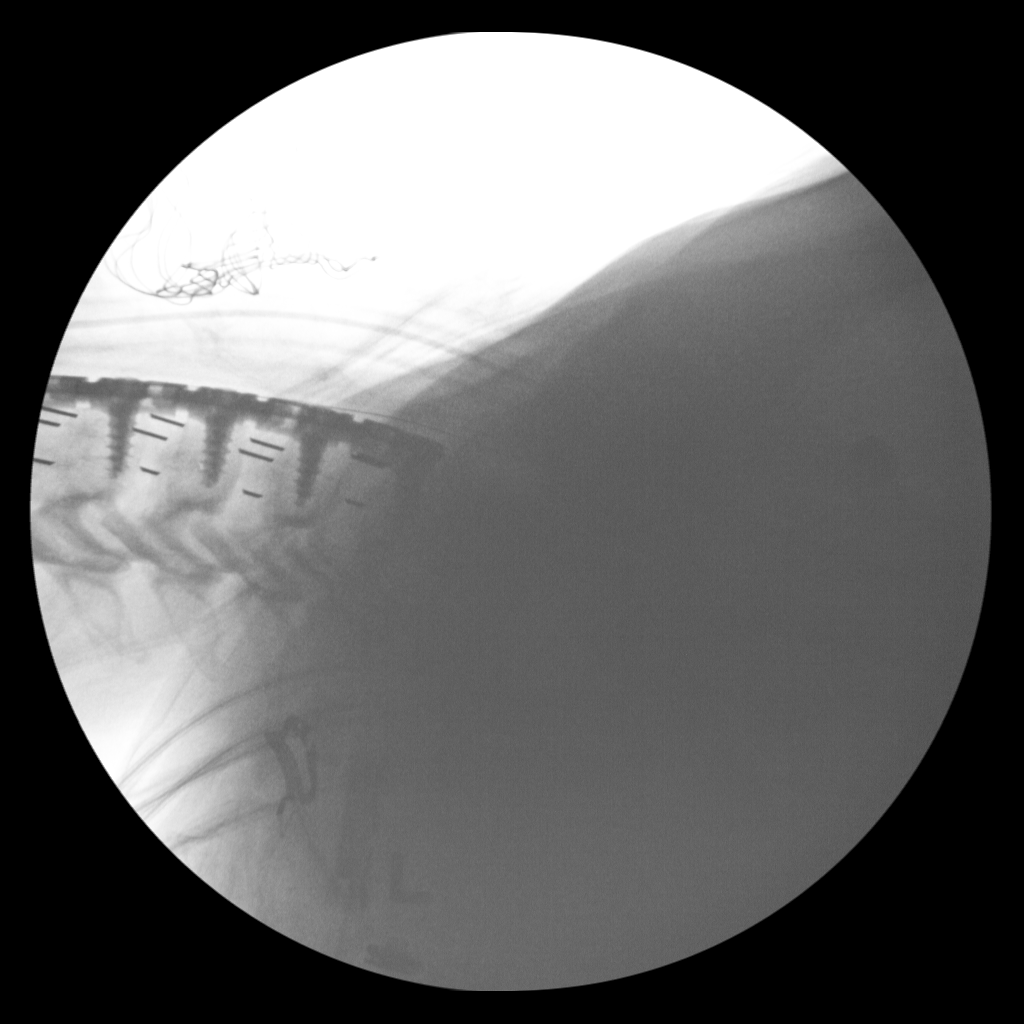

[2 of 2 positions shown; findings below may reference images not displayed]

FINDINGS: C3 through C7 anterior and interbody fusion. Hardware intact.
Anatomic alignment. No acute bony abnormality.
IMPRESSION: C3 through C7 anterior and interbody fusion.  Anatomic alignment.

## 2020-02-23 ENCOUNTER — Other Ambulatory Visit: Payer: Self-pay

## 2020-02-23 ENCOUNTER — Emergency Department (HOSPITAL_COMMUNITY): Payer: Self-pay

## 2020-02-23 ENCOUNTER — Encounter (HOSPITAL_COMMUNITY): Payer: Self-pay | Admitting: Emergency Medicine

## 2020-02-23 ENCOUNTER — Emergency Department (HOSPITAL_COMMUNITY)
Admission: EM | Admit: 2020-02-23 | Discharge: 2020-02-23 | Disposition: A | Discharge status: Home / Self Care | Payer: Self-pay | Attending: Emergency Medicine | Admitting: Emergency Medicine

## 2020-02-23 DIAGNOSIS — F17210 Nicotine dependence, cigarettes, uncomplicated: Secondary | ICD-10-CM | POA: Insufficient documentation

## 2020-02-23 DIAGNOSIS — R1013 Epigastric pain: Secondary | ICD-10-CM | POA: Insufficient documentation

## 2020-02-23 DIAGNOSIS — Z8739 Personal history of other diseases of the musculoskeletal system and connective tissue: Secondary | ICD-10-CM | POA: Insufficient documentation

## 2020-02-23 LAB — URINALYSIS, ROUTINE W REFLEX MICROSCOPIC
Bacteria, UA: NONE SEEN
Bilirubin Urine: NEGATIVE
Glucose, UA: NEGATIVE mg/dL
Ketones, ur: NEGATIVE mg/dL
Nitrite: NEGATIVE
Protein, ur: NEGATIVE mg/dL
Specific Gravity, Urine: 1.035 — ABNORMAL HIGH (ref 1.005–1.030)
pH: 7 (ref 5.0–8.0)

## 2020-02-23 LAB — COMPREHENSIVE METABOLIC PANEL
ALT: 22 U/L (ref 0–44)
AST: 25 U/L (ref 15–41)
Albumin: 4.1 g/dL (ref 3.5–5.0)
Alkaline Phosphatase: 77 U/L (ref 38–126)
Anion gap: 12 (ref 5–15)
BUN: 13 mg/dL (ref 6–20)
CO2: 26 mmol/L (ref 22–32)
Calcium: 9.9 mg/dL (ref 8.9–10.3)
Chloride: 94 mmol/L — ABNORMAL LOW (ref 98–111)
Creatinine, Ser: 0.74 mg/dL (ref 0.44–1.00)
GFR, Estimated: >60 mL/min (ref >60)
Glucose, Bld: 146 mg/dL — ABNORMAL HIGH (ref 70–99)
Potassium: 3.3 mmol/L — ABNORMAL LOW (ref 3.5–5.1)
Sodium: 132 mmol/L — ABNORMAL LOW (ref 135–145)
Total Bilirubin: 1.1 mg/dL (ref 0.3–1.2)
Total Protein: 7.5 g/dL (ref 6.5–8.1)

## 2020-02-23 LAB — CBC
HCT: 46.3 % — ABNORMAL HIGH (ref 36.0–46.0)
Hemoglobin: 16 g/dL — ABNORMAL HIGH (ref 12.0–15.0)
MCH: 31.8 pg (ref 26.0–34.0)
MCHC: 34.6 g/dL (ref 30.0–36.0)
MCV: 92 fL (ref 80.0–100.0)
Platelets: 188 10*3/uL (ref 150–400)
RBC: 5.03 MIL/uL (ref 3.87–5.11)
RDW: 11.1 % — ABNORMAL LOW (ref 11.5–15.5)
WBC: 8.7 10*3/uL (ref 4.0–10.5)
nRBC: 0 % (ref 0.0–0.2)

## 2020-02-23 LAB — POC URINE PREG, ED: Preg Test, Ur: NEGATIVE

## 2020-02-23 LAB — LIPASE, BLOOD: Lipase: 69 U/L — ABNORMAL HIGH (ref 11–51)

## 2020-02-23 MED ORDER — IOHEXOL 300 MG/ML  SOLN
75.0000 mL | Freq: Once | INTRAMUSCULAR | Status: AC | PRN
  Administered 2020-02-23: 75 mL via INTRAVENOUS

## 2020-02-23 MED ORDER — SODIUM CHLORIDE 0.9 % IV BOLUS
1000.0000 mL | Freq: Once | INTRAVENOUS | Status: AC
  Administered 2020-02-23: 1000 mL via INTRAVENOUS

## 2020-02-23 MED ORDER — MORPHINE SULFATE (PF) 4 MG/ML IV SOLN
4.0000 mg | Freq: Once | INTRAVENOUS | Status: AC
  Administered 2020-02-23: 4 mg via INTRAVENOUS
  Filled 2020-02-23: qty 1

## 2020-02-23 MED ORDER — ONDANSETRON HCL 8 MG PO TABS: 8.0000 mg | ORAL_TABLET | Freq: Three times a day (TID) | ORAL | 0 refills | Status: AC | PRN

## 2020-02-23 MED ORDER — HYDROCODONE-ACETAMINOPHEN 5-325 MG PO TABS
1.0000 | ORAL_TABLET | Freq: Four times a day (QID) | ORAL | 0 refills | Status: DC | PRN
Start: 2020-02-23 — End: 2023-11-22

## 2020-02-23 MED ORDER — ONDANSETRON HCL 4 MG/2ML IJ SOLN
4.0000 mg | Freq: Once | INTRAMUSCULAR | Status: AC
  Administered 2020-02-23: 4 mg via INTRAVENOUS
  Filled 2020-02-23: qty 2

## 2020-02-23 NOTE — ED Provider Notes (Signed)
[facility] Provider Note   CSN: [id number] Arrival date & time: 02/23/20  0740     History Chief Complaint  Patient presents with  . Abdominal Pain    [facility] is a 54 y.o. female.  Patient is a 54 year old female with past medical history of degenerative disc disease with prior surgery, rheumatoid arthritis.  She presents today for evaluation of abdominal pain and vomiting.  Patient states that for the past 5 days she has been unable to keep anything down.  She describes generalized abdominal pain, but is most uncomfortable in the epigastric region.  She denies diarrhea or bloody stool.  She denies any bloody emesis.  She describes that as a feeling that her "intestines are leaking".  She also describes a burning in her throat.  The history is provided by the patient.  Abdominal Pain Pain location:  Generalized Pain quality: cramping   Pain radiates to:  Does not radiate Pain severity:  Severe Onset quality:  Gradual Duration:  5 days Timing:  Constant Progression:  Worsening Chronicity:  New Relieved by:  Nothing Worsened by:  Nothing Ineffective treatments:  None tried      Past Medical History:  Diagnosis Date  . Arthritis   . DDD (degenerative disc disease), cervical   . Heart murmur 01/27/2017   pt has been told that she has a "murmur or flutter" all her life, asymptomatic  . Rheumatoid arthritis (HCC) 01/27/2017  . Rheumatoid arthritis (HCC)   . Spinal stenosis     Patient Active Problem List   Diagnosis Date Noted  . Cervical spinal stenosis 02/04/2017    Past Surgical History:  Procedure Laterality Date  . ABDOMINAL SURGERY    . ANTERIOR CERVICAL DECOMPRESSION/DISCECTOMY FUSION 4 LEVELS N/A 02/04/2017   Procedure: Cervical three-four, Cervicval four-five, Cervical five-six, Cervical six-seven Anterior cervical decompression/discectomy/fusion;  Surgeon: Julio Sicks, MD;  Location: [facility] OR;  Service: Neurosurgery;  Laterality:  N/A;  . BACK SURGERY     2001-2002 micro discectomy 2003 reconsturctive back surgery.   Marland Kitchen LAPAROSCOPIC SALPINGO OOPHERECTOMY    . TONSILLECTOMY       OB History    Gravida  4   Para  3   Term  3   Preterm      AB  1   Living  3     SAB  1   TAB      Ectopic      Multiple      Live Births              Family History  Problem Relation Age of Onset  . Arthritis Mother     Social History   Tobacco Use  . Smoking status: Current Every Day Smoker    Packs/day: 1.00    Years: 25.00    Pack years: 25.00    Types: Cigarettes  . Smokeless tobacco: Never Used  Vaping Use  . Vaping Use: Never used  Substance Use Topics  . Alcohol use: Yes    Comment: occasional  . Drug use: No    Home Medications Prior to Admission medications   Medication Sig Start Date End Date Taking? Authorizing Provider  acetaminophen (NON-ASPIRIN) 325 MG tablet Take 650-975 mg by mouth 3 (three) times daily as needed (for back pain).    [provider]  cyclobenzaprine (FLEXERIL) 10 MG tablet Take 1 tablet (10 mg total) by mouth 2 (two) times daily as needed for muscle spasms. Patient not taking:  Reported on 01/26/2017 01/10/17   Robinson, [place] N, PA-C  diazepam (VALIUM) 5 MG tablet Take 1 tablet (5 mg total) by mouth every 6 (six) hours as needed for muscle spasms. Patient not taking: Reported on 08/16/2017 02/05/17   Julio Sicks, MD  Multiple Vitamins-Minerals (ONE-A-DAY MENOPAUSE FORMULA PO) Take 1 tablet by mouth daily.    [provider]  oxyCODONE-acetaminophen (PERCOCET/ROXICET) 5-325 MG tablet Take 1-2 tablets by mouth every 6 (six) hours as needed for severe pain. Patient not taking: Reported on 08/16/2017 02/05/17   Julio Sicks, MD    Allergies    Ambien [zolpidem], Benadryl [diphenhydramine hcl (sleep)], Neurontin [gabapentin], Nyquil multi-symptom [pseudoeph-doxylamine-dm-apap], Paxil [paroxetine hcl], Penicillins, Sulfur, and Duragesic-100  [fentanyl]  Review of Systems   Review of Systems  Gastrointestinal: Positive for abdominal pain.  All other systems reviewed and are negative.   Physical Exam Updated Vital Signs BP (!) 120/91 (BP Location: Left Arm)   Pulse 89   Temp 98.2 F (36.8 C) (Oral)   Resp (!) 21   Ht 5\' 1"  (1.549 m)   Wt 56.7 kg   LMP 05/18/2014 (Approximate)   SpO2 99%   BMI 23.62 kg/m   Physical Exam Vitals and nursing note reviewed.  Constitutional:      General: She is not in acute distress.    Appearance: She is well-developed. She is not diaphoretic.  HENT:     Head: Normocephalic and atraumatic.     Mouth/Throat:     Mouth: Mucous membranes are moist.  Cardiovascular:     Rate and Rhythm: Normal rate and regular rhythm.     Heart sounds: No murmur heard.  No friction rub. No gallop.   Pulmonary:     Effort: Pulmonary effort is normal. No respiratory distress.     Breath sounds: Normal breath sounds. No wheezing.  Abdominal:     General: Bowel sounds are normal. There is no distension.     Palpations: Abdomen is soft.     Tenderness: There is abdominal tenderness in the epigastric area. There is no right CVA tenderness, left CVA tenderness, guarding or rebound.  Musculoskeletal:        General: Normal range of motion.     Cervical back: Normal range of motion and neck supple.  Skin:    General: Skin is warm and dry.  Neurological:     Mental Status: She is alert and oriented to person, place, and time.     ED Results / Procedures / Treatments   Labs (all labs ordered are listed, but only abnormal results are displayed) Labs Reviewed  LIPASE, BLOOD  COMPREHENSIVE METABOLIC PANEL  CBC  URINALYSIS, ROUTINE W REFLEX MICROSCOPIC  POC URINE PREG, ED    EKG None  Radiology No results found.  Procedures Procedures (including critical care time)  Medications Ordered in ED Medications  sodium chloride 0.9 % bolus 1,000 mL (has no administration in time range)   ondansetron (ZOFRAN) injection 4 mg (has no administration in time range)  morphine 4 MG/ML injection 4 mg (has no administration in time range)    ED Course  I have reviewed the triage vital signs and the nursing notes.  Pertinent labs & imaging results that were available during my care of the patient were reviewed by me and considered in my medical decision making (see chart for details).    MDM Rules/Calculators/A&P  Patient is a 54 year old female presenting with complaints of abdominal discomfort.  This has been worsening over the  past 2 weeks.  She describes episodes of vomiting.  Patient's vital signs are stable.  She does have some discomfort in the epigastric region, but no peritoneal signs on exam.  Patient has no white count and laboratory studies are unremarkable with the exception of a mildly elevated lipase, the significance of which I am uncertain.  Patient had a CT scan showing intrahepatic and extrahepatic biliary ductal dilatation of the bile duct of unknown determine significance.  MRCP has recommended to further evaluate.  Due to the patient's CT findings and ongoing symptoms for many months, I feel as though follow-up with GI would be appropriate.  Referral will be made, but patient seems appropriate for discharge with medicine for nausea and pain.  Final Clinical Impression(s) / ED Diagnoses Final diagnoses:  None    Rx / DC Orders ED Discharge Orders    None       Geoffery Lyons, MD 02/23/20 1119

## 2020-02-23 NOTE — ED Triage Notes (Signed)
Pt reports right sided abdominal pain that started Sunday 02/18/2020. Pt also reports vomiting for 6 months. Pt stating "it feels like my intestines are leaking, I can feel it bubbling out."

## 2020-02-23 NOTE — Discharge Instructions (Addendum)
Begin taking Zofran as prescribed as needed for nausea and hydrocodone as prescribed as needed for pain.  Follow-up with gastroenterology in the next week to address the ongoing symptoms you are experiencing as well as the abnormal finding that was on your CT scan.  The contact information for Dr. Levon Hedger has been provided in this discharge summary for you to call and make these arrangements.  Return to the ER in the meantime if you develop worsening pain, high fever, bloody stool or vomit, or other new and concerning symptoms.

## 2021-08-13 IMAGING — CT CT ABD-PELV W/ CM
2 of 6 series · 16 of 46 positions shown, 18 images · IV contrast (Omnipaque or Isovue)
Comparison: No available priors.

CLINICAL DATA: Right-sided abdominal pain starting [REDACTED]. Vomiting
for 6 months.

EXAM:
CT ABDOMEN AND PELVIS WITH CONTRAST
TECHNIQUE: Multidetector CT imaging of the abdomen and pelvis was performed
using the standard protocol following bolus administration of
intravenous contrast.
CONTRAST:  75mL OMNIPAQUE IOHEXOL 300 MG/ML  SOLN

[Series 4: axial st · axial · 0.68mm/px · z∈[+643,+1008]mm · 13 of 85 slices shown, 15 images]
[im 6/85  soft-tissue]
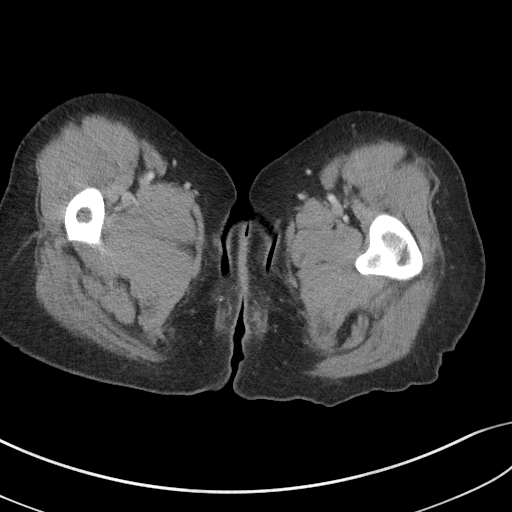
[im 6/85  bone]
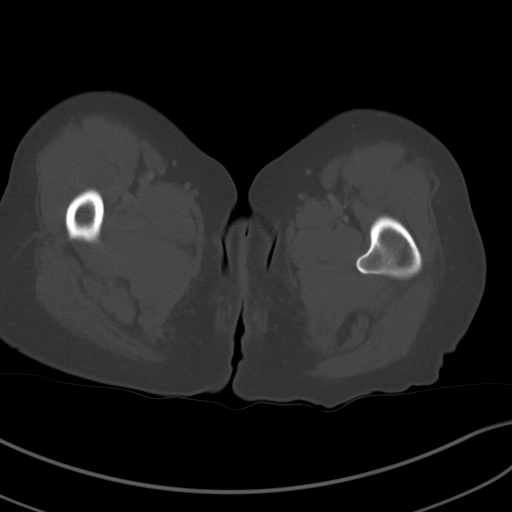
[im 11/85  soft-tissue]
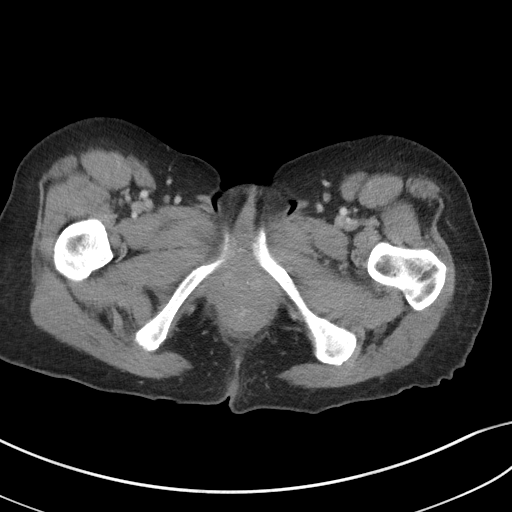
[im 16/85  soft-tissue]
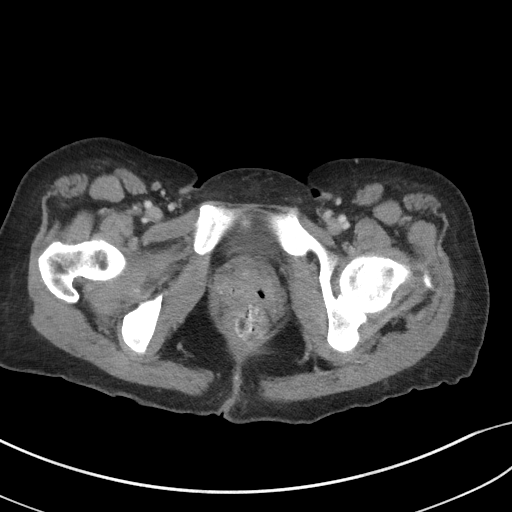
[im 27/85  soft-tissue]
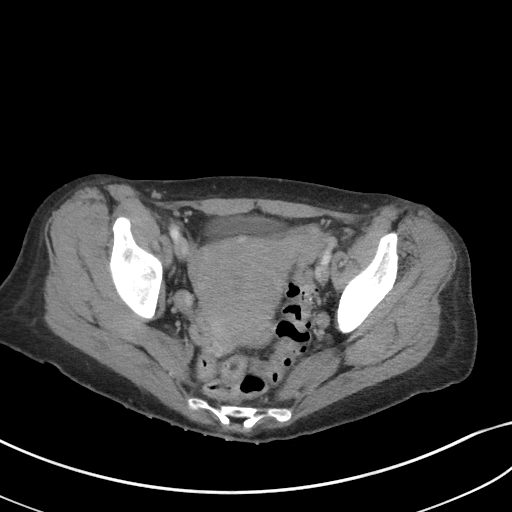
[im 32/85  soft-tissue]
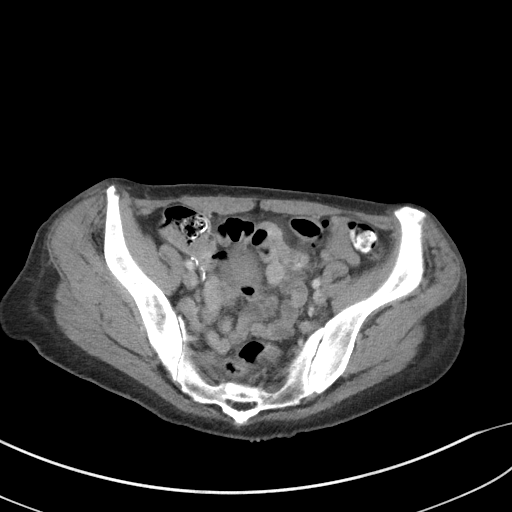
[im 37/85  soft-tissue]
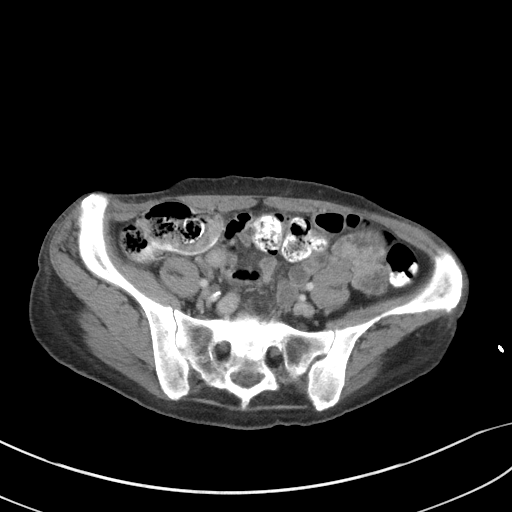
[im 43/85  soft-tissue]
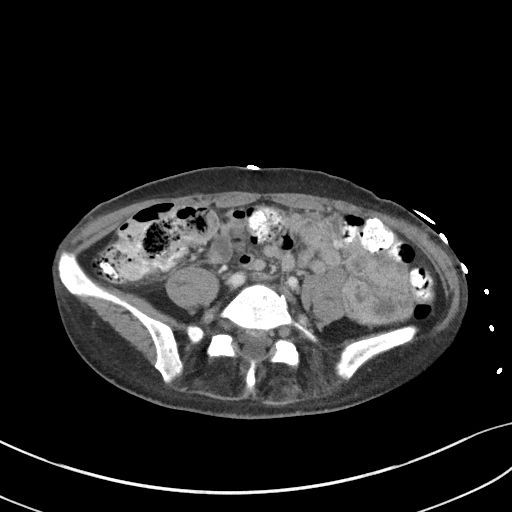
[im 48/85  soft-tissue]
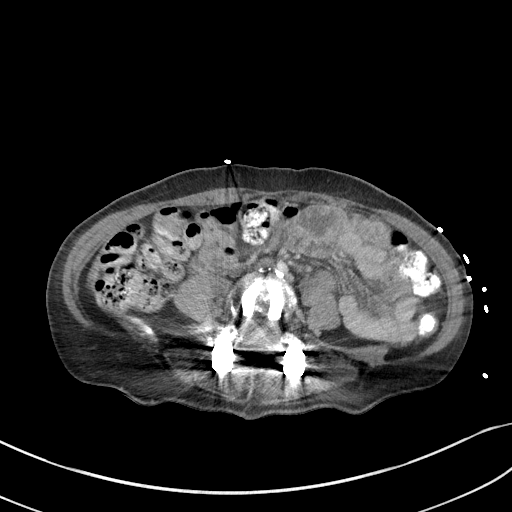
[im 53/85  soft-tissue]
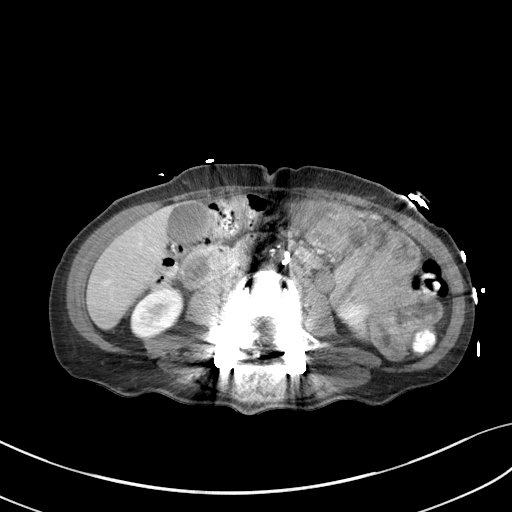
[im 53/85  bone]
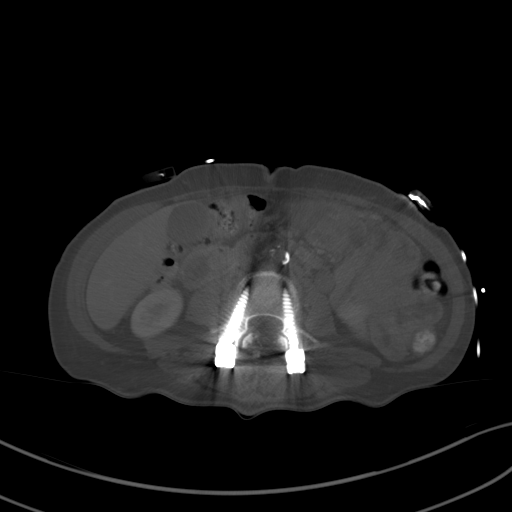
[im 58/85  soft-tissue]
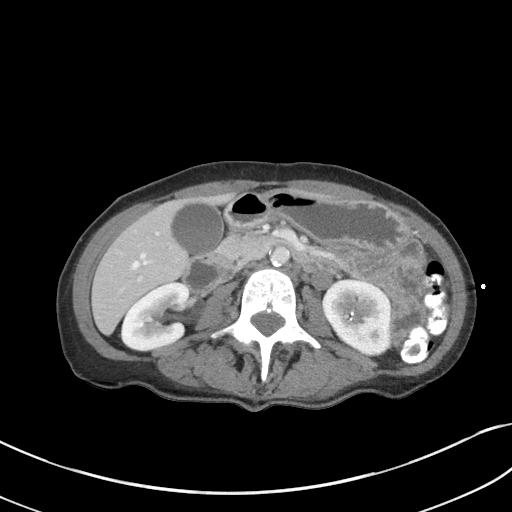
[im 69/85  soft-tissue]
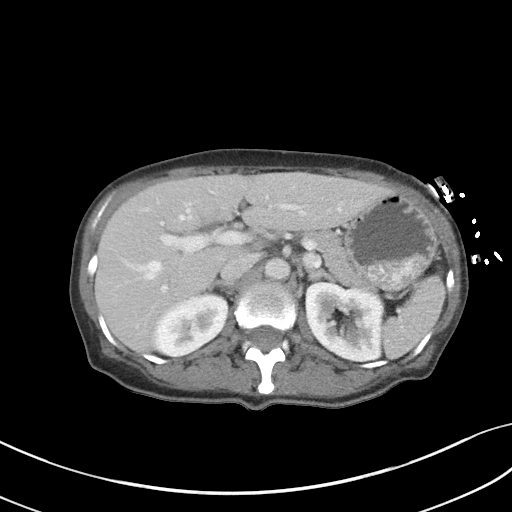
[im 74/85  soft-tissue]
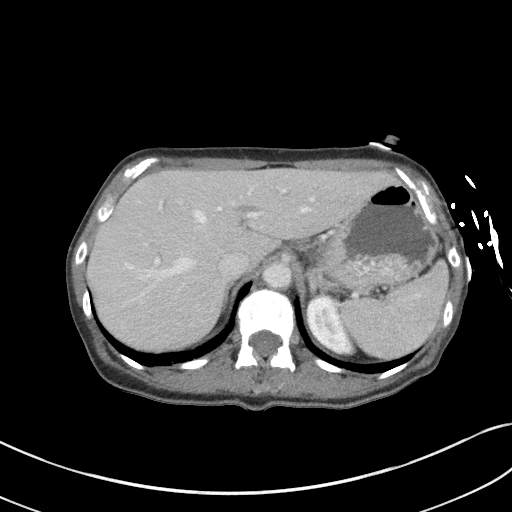
[im 79/85  soft-tissue]
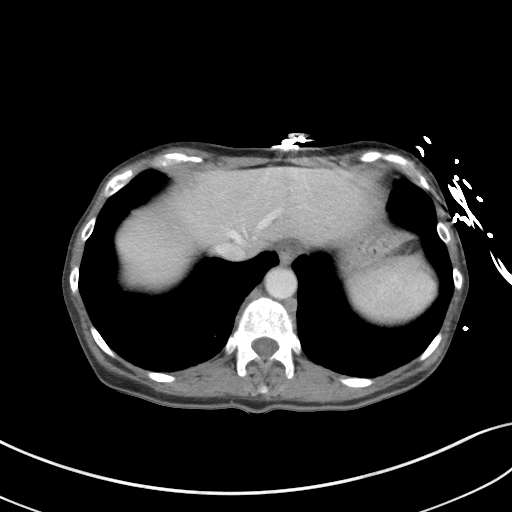

[Series 7: coronal st · coronal · 0.72mm/px · 3 of 80 slices shown]
[im 27/80  soft-tissue]
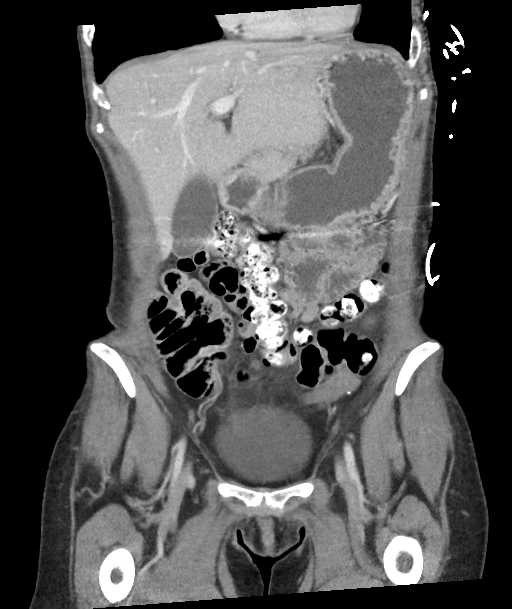
[im 36/80  soft-tissue]
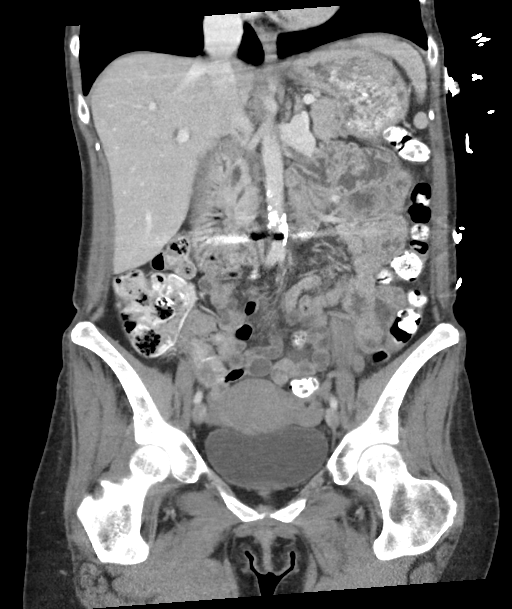
[im 44/80  soft-tissue]
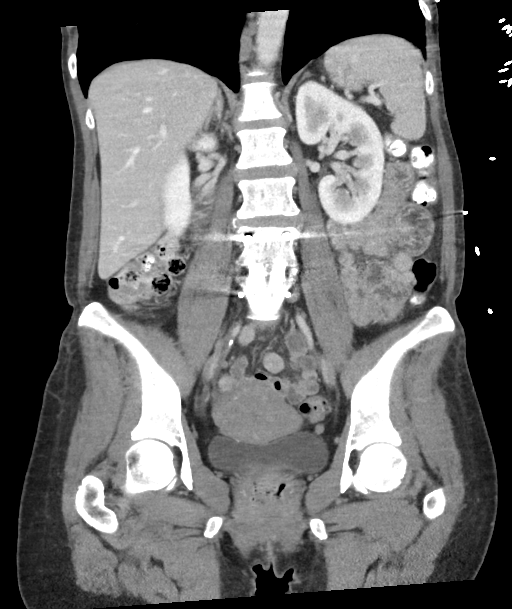

[16 of 46 positions shown; findings below may reference images not displayed]

FINDINGS: Lower chest: Lung bases are clear. Heart size normal. No pericardial
or pleural effusion. Distal esophagus is unremarkable.

Hepatobiliary: Liver and gallbladder are unremarkable. Mild biliary
duct dilatation. Common bile duct measures up to 10 mm ([DATE]). There
is tapering distally. Difficult to exclude an ampullary mass.

Pancreas: Negative.

Spleen: Negative.

Adrenals/Urinary Tract: Adrenal glands and right kidney are
unremarkable. Small stones in the left kidney. Ureters are
decompressed. Bladder is grossly unremarkable.

Stomach/Bowel: Stomach is unremarkable. Again, a small ampullary
mass cannot be excluded ([REDACTED]). Small bowel, appendix and colon
are unremarkable.

Vascular/Lymphatic: Atherosclerotic calcification of the aorta
without aneurysm. No pathologically enlarged lymph nodes.

Reproductive: Uterus is visualized.  No adnexal mass.

Other: No free fluid.  Mesenteries and peritoneum are unremarkable.

Musculoskeletal: Postoperative changes in the lumbar spine. No
worrisome lytic or sclerotic lesions.
IMPRESSION: 1. Intrahepatic and extrahepatic biliary ductal dilatation with
tapering of the lower common bile duct. Difficult to exclude an
ampullary mass. Non emergent MR abdomen/MRCP without and with
contrast would likely be helpful in further evaluation, as
clinically indicated.
2. No additional findings to explain the patient's symptoms.
3. Left renal stones.
4.  Aortic atherosclerosis ([id number]).

## 2023-06-13 ENCOUNTER — Emergency Department (HOSPITAL_COMMUNITY): Payer: Self-pay

## 2023-06-13 ENCOUNTER — Encounter (HOSPITAL_COMMUNITY): Payer: Self-pay

## 2023-06-13 ENCOUNTER — Emergency Department (HOSPITAL_COMMUNITY)
Admission: EM | Admit: 2023-06-13 | Discharge: 2023-06-13 | Disposition: A | Discharge status: Home / Self Care | Payer: Self-pay | Attending: Emergency Medicine | Admitting: Emergency Medicine

## 2023-06-13 ENCOUNTER — Other Ambulatory Visit: Payer: Self-pay

## 2023-06-13 DIAGNOSIS — F17200 Nicotine dependence, unspecified, uncomplicated: Secondary | ICD-10-CM | POA: Insufficient documentation

## 2023-06-13 DIAGNOSIS — R0789 Other chest pain: Secondary | ICD-10-CM

## 2023-06-13 DIAGNOSIS — R1011 Right upper quadrant pain: Secondary | ICD-10-CM | POA: Insufficient documentation

## 2023-06-13 DIAGNOSIS — J189 Pneumonia, unspecified organism: Secondary | ICD-10-CM | POA: Insufficient documentation

## 2023-06-13 DIAGNOSIS — E871 Hypo-osmolality and hyponatremia: Secondary | ICD-10-CM | POA: Insufficient documentation

## 2023-06-13 DIAGNOSIS — E876 Hypokalemia: Secondary | ICD-10-CM

## 2023-06-13 LAB — CBC WITH DIFFERENTIAL/PLATELET
Abs Immature Granulocytes: 0.17 10*3/uL — ABNORMAL HIGH (ref 0.00–0.07)
Basophils Absolute: 0.1 10*3/uL (ref 0.0–0.1)
Basophils Relative: 1 %
Eosinophils Absolute: 0 10*3/uL (ref 0.0–0.5)
Eosinophils Relative: 0 %
HCT: 45.6 % (ref 36.0–46.0)
Hemoglobin: 15.7 g/dL — ABNORMAL HIGH (ref 12.0–15.0)
Immature Granulocytes: 2 %
Lymphocytes Relative: 19 %
Lymphs Abs: 1.6 10*3/uL (ref 0.7–4.0)
MCH: 31.8 pg (ref 26.0–34.0)
MCHC: 34.4 g/dL (ref 30.0–36.0)
MCV: 92.3 fL (ref 80.0–100.0)
Monocytes Absolute: 0.7 10*3/uL (ref 0.1–1.0)
Monocytes Relative: 8 %
Neutro Abs: 5.9 10*3/uL (ref 1.7–7.7)
Neutrophils Relative %: 70 %
Platelets: 370 10*3/uL (ref 150–400)
RBC: 4.94 MIL/uL (ref 3.87–5.11)
RDW: 13.5 % (ref 11.5–15.5)
WBC: 8.4 10*3/uL (ref 4.0–10.5)
nRBC: 0 % (ref 0.0–0.2)

## 2023-06-13 LAB — COMPREHENSIVE METABOLIC PANEL
ALT: 44 U/L (ref 0–44)
AST: 34 U/L (ref 15–41)
Albumin: 3.1 g/dL — ABNORMAL LOW (ref 3.5–5.0)
Alkaline Phosphatase: 87 U/L (ref 38–126)
Anion gap: 8 (ref 5–15)
BUN: 16 mg/dL (ref 6–20)
CO2: 25 mmol/L (ref 22–32)
Calcium: 8.2 mg/dL — ABNORMAL LOW (ref 8.9–10.3)
Chloride: 97 mmol/L — ABNORMAL LOW (ref 98–111)
Creatinine, Ser: 0.6 mg/dL (ref 0.44–1.00)
GFR, Estimated: >60 mL/min (ref >60)
Glucose, Bld: 146 mg/dL — ABNORMAL HIGH (ref 70–99)
Potassium: 2.9 mmol/L — ABNORMAL LOW (ref 3.5–5.1)
Sodium: 130 mmol/L — ABNORMAL LOW (ref 135–145)
Total Bilirubin: 0.6 mg/dL (ref 0.0–1.2)
Total Protein: 7.6 g/dL (ref 6.5–8.1)

## 2023-06-13 MED ORDER — POTASSIUM CHLORIDE CRYS ER 20 MEQ PO TBCR
40.0000 meq | EXTENDED_RELEASE_TABLET | Freq: Once | ORAL | Status: AC
  Administered 2023-06-13: 40 meq via ORAL
  Filled 2023-06-13: qty 2

## 2023-06-13 MED ORDER — METHYLPREDNISOLONE SODIUM SUCC 125 MG IJ SOLR
125.0000 mg | Freq: Once | INTRAMUSCULAR | Status: AC
  Administered 2023-06-13: 125 mg via INTRAVENOUS
  Filled 2023-06-13: qty 2

## 2023-06-13 MED ORDER — ALBUTEROL SULFATE HFA 108 (90 BASE) MCG/ACT IN AERS: 2.0000 | INHALATION_SPRAY | RESPIRATORY_TRACT | 3 refills | Status: AC | PRN

## 2023-06-13 MED ORDER — LEVOFLOXACIN 750 MG PO TABS: 750.0000 mg | ORAL_TABLET | Freq: Every day | ORAL | 0 refills | Status: AC

## 2023-06-13 MED ORDER — METHOCARBAMOL 500 MG PO TABS: 500.0000 mg | ORAL_TABLET | Freq: Two times a day (BID) | ORAL | 0 refills | Status: AC | PRN

## 2023-06-13 MED ORDER — OXYCODONE-ACETAMINOPHEN 5-325 MG PO TABS
2.0000 | ORAL_TABLET | Freq: Once | ORAL | Status: AC
  Administered 2023-06-13: 2 via ORAL
  Filled 2023-06-13: qty 2

## 2023-06-13 MED ORDER — MELOXICAM 15 MG PO TABS: 15.0000 mg | ORAL_TABLET | Freq: Every day | ORAL | 0 refills | Status: AC

## 2023-06-13 MED ORDER — KETOROLAC TROMETHAMINE 30 MG/ML IJ SOLN
30.0000 mg | Freq: Once | INTRAMUSCULAR | Status: AC
  Administered 2023-06-13: 30 mg via INTRAVENOUS
  Filled 2023-06-13: qty 1

## 2023-06-13 MED ORDER — LEVOFLOXACIN IN D5W 750 MG/150ML IV SOLN
750.0000 mg | Freq: Once | INTRAVENOUS | Status: AC
  Administered 2023-06-13: 750 mg via INTRAVENOUS
  Filled 2023-06-13: qty 150

## 2023-06-13 MED ORDER — IPRATROPIUM-ALBUTEROL 0.5-2.5 (3) MG/3ML IN SOLN
3.0000 mL | Freq: Once | RESPIRATORY_TRACT | Status: AC
  Administered 2023-06-13: 3 mL via RESPIRATORY_TRACT
  Filled 2023-06-13: qty 3

## 2023-06-13 NOTE — Discharge Instructions (Addendum)
 It appears that you do have a small amount of pneumonia in both of your lungs, this should get better over the next week but you will continue to have coughing show I want you to take the following medications  Please take Mobic,  once daily as needed for pain - this in an antiinflammatory medicine (NSAID) and is similar to ibuprofen - many people feel that it is stronger than ibuprofen and it is easier to take since it is a smaller pill.  Please use this only for 1 week - if your pain persists, you will need to follow up with your doctor in the office for ongoing guidance and pain control.  Please take Robaxin, 500 mg up to 2 or 3 times a day as needed for muscle spasm, this is a muscle relaxer, it may cause generalized weakness, sleepiness and you should not drive or do important things while taking this medication.  This includes driving a vehicle or taking care of young children, these things should not be done while taking this medication.  Levaquin 1 tablet/day for 7 days to treat the pneumonia  Albuterol is an inhaled medication which can help you to breathe better, you should take 2 puffs every 4 hours as needed, this may cause your heart to feel like it is racing, this should be a temporary side effect.  Thank you for allowing [place] to treat you in the emergency department today.  After reviewing your examination and potential testing that was done it appears that you are safe to go home.  I would like for you to follow-up with your doctor within the next several days, have them obtain your records and follow-up with them to review all potential tests and results from your visit.  If you should develop severe or worsening symptoms return to the emergency department immediately  Your family doctor will need to recheck your potassium in 1 week

## 2023-06-13 NOTE — ED Provider Notes (Signed)
 [facility] Provider Note   CSN: [id number] Arrival date & time: 06/13/23  1434     History  Chief Complaint  Patient presents with   Abdominal Pain    [facility] is a 58 y.o. female.   Abdominal Pain  This patient is a 58 year old female, she is a 2 pack/day smoker, she presents to the hospital with a complaint of right-sided lower chest and upper abdominal pain which has been ongoing for the last 3 days.  She came down with flulike illness with fevers chills body aches coughing and upper respiratory symptoms approximately 1 week ago, reports that she was formally diagnosed with the flu and does some of the symptoms of gotten better she has now developed a sharp and stabbing right-sided chest pain when she takes a deep breath or when she coughs.  No rash in this area, denies any ongoing fevers, no swelling of the legs, no history of blood clots, last cigarette was 1 week ago    Home Medications Prior to Admission medications   Medication Sig Start Date End Date Taking? Authorizing Provider  albuterol (VENTOLIN HFA) 108 (90 Base) MCG/ACT inhaler Inhale 2 puffs into the lungs every 4 (four) hours as needed for wheezing or shortness of breath. 06/13/23  Yes Eber Hong, MD  levofloxacin (LEVAQUIN) 750 MG tablet Take 1 tablet (750 mg total) by mouth daily. 06/13/23  Yes Eber Hong, MD  meloxicam (MOBIC) 15 MG tablet Take 1 tablet (15 mg total) by mouth daily for 14 days. 06/13/23 06/27/23 Yes Eber Hong, MD  methocarbamol (ROBAXIN) 500 MG tablet Take 1 tablet (500 mg total) by mouth 2 (two) times daily as needed for muscle spasms. 06/13/23  Yes Eber Hong, MD  acetaminophen (NON-ASPIRIN) 325 MG tablet Take 650-975 mg by mouth 3 (three) times daily as needed (for back pain).    [provider]  cyclobenzaprine (FLEXERIL) 10 MG tablet Take 1 tablet (10 mg total) by mouth 2 (two) times daily as needed for muscle spasms. Patient not  taking: Reported on 01/26/2017 01/10/17   Robinson, [place] N, PA-C  diazepam (VALIUM) 5 MG tablet Take 1 tablet (5 mg total) by mouth every 6 (six) hours as needed for muscle spasms. Patient not taking: Reported on 08/16/2017 02/05/17   Julio Sicks, MD  HYDROcodone-acetaminophen (NORCO) 5-325 MG tablet Take 1-2 tablets by mouth every 6 (six) hours as needed. 02/23/20   Geoffery Lyons, MD  Multiple Vitamins-Minerals (ONE-A-DAY MENOPAUSE FORMULA PO) Take 1 tablet by mouth daily.    [provider]  ondansetron (ZOFRAN) 8 MG tablet Take 1 tablet (8 mg total) by mouth every 8 (eight) hours as needed for nausea. 02/23/20   Geoffery Lyons, MD  oxyCODONE-acetaminophen (PERCOCET/ROXICET) 5-325 MG tablet Take 1-2 tablets by mouth every 6 (six) hours as needed for severe pain. Patient not taking: Reported on 08/16/2017 02/05/17   Julio Sicks, MD      Allergies    Ambien [zolpidem], Benadryl [diphenhydramine hcl (sleep)], Elemental sulfur, Neurontin [gabapentin], Nyquil multi-symptom [pseudoeph-doxylamine-dm-apap], Paxil [paroxetine hcl], Penicillins, and Duragesic-100 [fentanyl]    Review of Systems   Review of Systems  Gastrointestinal:  Positive for abdominal pain.  All other systems reviewed and are negative.   Physical Exam Updated Vital Signs BP 111/71   Pulse 99   Temp 98.2 F (36.8 C) (Oral)   Resp (!) 25   Ht 1.549 m (5\' 1" )   Wt 56.7 kg   LMP 05/18/2014 (  Approximate)   SpO2 91%   BMI 23.62 kg/m  Physical Exam Vitals and nursing note reviewed.  Constitutional:      General: She is not in acute distress.    Appearance: She is well-developed.  HENT:     Head: Normocephalic and atraumatic.     Mouth/Throat:     Pharynx: No oropharyngeal exudate.  Eyes:     General: No scleral icterus.       Right eye: No discharge.        Left eye: No discharge.     Conjunctiva/sclera: Conjunctivae normal.     Pupils: Pupils are equal, round, and reactive to light.  Neck:     Thyroid: No  thyromegaly.     Vascular: No JVD.  Cardiovascular:     Rate and Rhythm: Normal rate and regular rhythm.     Heart sounds: Normal heart sounds. No murmur heard.    No friction rub. No gallop.  Pulmonary:     Effort: Pulmonary effort is normal. No respiratory distress.     Breath sounds: Normal breath sounds. No wheezing or rales.  Chest:     Chest wall: Tenderness present.  Abdominal:     General: Bowel sounds are normal. There is no distension.     Palpations: Abdomen is soft. There is no mass.     Tenderness: There is abdominal tenderness.  Musculoskeletal:        General: No tenderness. Normal range of motion.     Cervical back: Normal range of motion and neck supple.  Lymphadenopathy:     Cervical: No cervical adenopathy.  Skin:    General: Skin is warm and dry.     Findings: No erythema or rash.  Neurological:     Mental Status: She is alert.     Coordination: Coordination normal.  Psychiatric:        Behavior: Behavior normal.     ED Results / Procedures / Treatments   Labs (all labs ordered are listed, but only abnormal results are displayed) Labs Reviewed  COMPREHENSIVE METABOLIC PANEL - Abnormal; Notable for the following components:      Result Value   Sodium 130 (*)    Potassium 2.9 (*)    Chloride 97 (*)    Glucose, Bld 146 (*)    Calcium 8.2 (*)    Albumin 3.1 (*)    All other components within normal limits  CBC WITH DIFFERENTIAL/PLATELET - Abnormal; Notable for the following components:   Hemoglobin 15.7 (*)    Abs Immature Granulocytes 0.17 (*)    All other components within normal limits    EKG None  Radiology DG Chest 2 View Result Date: 06/13/2023 CLINICAL DATA:  Cough and shortness of breath. EXAM: CHEST - 2 VIEW COMPARISON:  None Available. FINDINGS: Bilateral lower lobe airspace disease suspicious for pneumonia. There is minimal additional patchy opacity in the right mid lung. The heart is normal in size. Normal mediastinal contours. Aortic  atherosclerosis. No pulmonary edema, pneumothorax or large pleural effusion. Lower cervical hardware partially included. IMPRESSION: Bilateral lower lobe airspace disease suspicious for pneumonia. Additional patchy opacity in the right mid lung. Electronically Signed   By: Narda Rutherford M.D.   On: 06/13/2023 15:57    Procedures Procedures    Medications Ordered in ED Medications  levofloxacin (LEVAQUIN) IVPB 750 mg (has no administration in time range)  methylPREDNISolone sodium succinate (SOLU-MEDROL) 125 mg/2 mL injection 125 mg (has no administration in time range)  ipratropium-albuterol (DUONEB)  0.5-2.5 (3) MG/3ML nebulizer solution 3 mL (has no administration in time range)  oxyCODONE-acetaminophen (PERCOCET/ROXICET) 5-325 MG per tablet 2 tablet (has no administration in time range)  potassium chloride SA (KLOR-CON M) CR tablet 40 mEq (has no administration in time range)  ketorolac (TORADOL) 30 MG/ML injection 30 mg (30 mg Intravenous Given 06/13/23 1557)    ED Course/ Medical Decision Making/ A&P                                 Medical Decision Making Amount and/or Complexity of Data Reviewed Labs: ordered. Radiology: ordered.  Risk Prescription drug management.    This patient presents to the ED for concern of chest wall pain differential diagnosis includes rib fracture, pneumothorax, muscle strain    Additional history obtained:  Additional history obtained from patient External records from outside source obtained and reviewed including prior report of abnormal liver test since she was a child, no formal diagnosis of liver issues no recent admissions to the hospital, she had a cervical neck surgery in 2018   Lab Tests:  I Ordered, and personally interpreted labs.  The pertinent results include: Mild hyponatremia, no leukocytosis   Imaging Studies ordered:  I ordered imaging studies including bilateral infiltrates at the bases I independently visualized and  interpreted imaging which showed community-acquired pneumonia I agree with the radiologist interpretation   Medicines ordered and prescription drug management:  I ordered medication including DuoNeb Solu-Medrol and antibiotics, oxygen was 91 to 94% on room air, no respiratory distress for pneumonia Reevaluation of the patient after these medicines showed that the patient improved I have reviewed the patients home medicines and have made adjustments as needed   Problem List / ED Course:  Patient has what appears to be respiratory infection, possibly bacterial although vital signs are unremarkable except for an oxygen of 91 to 94%.  She has underlying COPD, she describes a history of emphysema, very heavy smoker.  She does not have inhalers at home, she will be prescribed a metered-dose inhaler.   Social Determinants of Health:  Heavy tobacco use           Final Clinical Impression(s) / ED Diagnoses Final diagnoses:  Chest wall pain  Community acquired pneumonia, unspecified laterality    Rx / DC Orders ED Discharge Orders          Ordered    albuterol (VENTOLIN HFA) 108 (90 Base) MCG/ACT inhaler  Every 4 hours PRN        06/13/23 1744    levofloxacin (LEVAQUIN) 750 MG tablet  Daily        06/13/23 1744    meloxicam (MOBIC) 15 MG tablet  Daily        06/13/23 1744    methocarbamol (ROBAXIN) 500 MG tablet  2 times daily PRN        06/13/23 1744              Eber Hong, MD 06/13/23 1746

## 2023-06-13 NOTE — ED Triage Notes (Signed)
 Pt reports RUQ pain when she coughs and states "feels like my liver is going to explode".  Pt had the flu one week ago.

## 2023-11-22 ENCOUNTER — Encounter (HOSPITAL_COMMUNITY): Payer: Self-pay | Admitting: Emergency Medicine

## 2023-11-22 ENCOUNTER — Emergency Department (HOSPITAL_COMMUNITY): Payer: Self-pay

## 2023-11-22 ENCOUNTER — Emergency Department (HOSPITAL_COMMUNITY)
Admission: EM | Admit: 2023-11-22 | Discharge: 2023-11-22 | Disposition: A | Discharge status: Home / Self Care | Payer: Self-pay | Attending: Student | Admitting: Student

## 2023-11-22 ENCOUNTER — Other Ambulatory Visit: Payer: Self-pay

## 2023-11-22 DIAGNOSIS — M25511 Pain in right shoulder: Secondary | ICD-10-CM | POA: Insufficient documentation

## 2023-11-22 DIAGNOSIS — F17210 Nicotine dependence, cigarettes, uncomplicated: Secondary | ICD-10-CM | POA: Insufficient documentation

## 2023-11-22 DIAGNOSIS — X500XXA Overexertion from strenuous movement or load, initial encounter: Secondary | ICD-10-CM | POA: Insufficient documentation

## 2023-11-22 DIAGNOSIS — G8911 Acute pain due to trauma: Secondary | ICD-10-CM | POA: Insufficient documentation

## 2023-11-22 MED ORDER — ACETAMINOPHEN 500 MG PO TABS: 1000.0000 mg | ORAL_TABLET | Freq: Three times a day (TID) | ORAL | 0 refills | Status: AC

## 2023-11-22 MED ORDER — OXYCODONE HCL 5 MG PO TABS
5.0000 mg | ORAL_TABLET | Freq: Four times a day (QID) | ORAL | 0 refills | Status: AC | PRN
Start: 2023-11-22 — End: ?

## 2023-11-22 MED ORDER — KETOROLAC TROMETHAMINE 15 MG/ML IJ SOLN
15.0000 mg | Freq: Once | INTRAMUSCULAR | Status: AC
  Administered 2023-11-22: 15 mg via INTRAMUSCULAR
  Filled 2023-11-22: qty 1

## 2023-11-22 MED ORDER — HYDROCODONE-ACETAMINOPHEN 5-325 MG PO TABS
1.0000 | ORAL_TABLET | Freq: Once | ORAL | Status: AC
  Administered 2023-11-22: 1 via ORAL
  Filled 2023-11-22: qty 1

## 2023-11-22 MED ORDER — LIDOCAINE 5 % EX PTCH
1.0000 | MEDICATED_PATCH | CUTANEOUS | Status: DC
  Administered 2023-11-22: 1 via TRANSDERMAL
  Filled 2023-11-22: qty 1

## 2023-11-22 MED ORDER — NAPROXEN 375 MG PO TABS: 375.0000 mg | ORAL_TABLET | Freq: Two times a day (BID) | ORAL | 0 refills | Status: AC

## 2023-11-22 NOTE — ED Triage Notes (Signed)
 Pt presents with right shoulder pain x 1 week, pain worsened after some heavy lifting x 3 days ago.

## 2023-11-22 NOTE — Discharge Instructions (Addendum)
 For pain:  - Acetaminophen 1000 mg three times daily (every 8 hours) - Naproxen 2 times daily (every 12 hours) - oxycodone for breakthrough pain only

## 2023-11-22 NOTE — ED Notes (Signed)
 Pt A&Ox4. Ambulated to ED room.

## 2023-11-22 NOTE — ED Provider Notes (Signed)
 [place] EMERGENCY DEPARTMENT AT [facility] Provider Note  CSN: [id number] Arrival date & time: 11/22/23 1615  Chief Complaint(s) Shoulder Pain (Right)  HPI Deborah Hoffman is a 58 y.o. female with PMH RA, spinal stenosis status post neck surgery, arthritis who presents emergency department for evaluation of right shoulder pain.  States that pain has been gradually worsening for last 1 week but significant worsened 3 days ago after picking up somebody and putting them in the bed.  States that pain is primarily in the shoulder radiating down to the elbow.  She states that this feels slightly similar to when she required surgery on her neck because the shoulder had dropped..  States that she feels less weak this time around and feels that she cannot move the shoulder because of pain.  Denies additional trauma to shoulder.  Denies chest pain, shortness of breath, abdominal pain, nausea, vomiting or other systemic symptoms.  Denies numbness, tingling of the upper extremity.   Past Medical History Past Medical History:  Diagnosis Date   Arthritis    DDD (degenerative disc disease), cervical    Heart murmur 01/27/2017   pt has been told that she has a murmur or flutter all her life, asymptomatic   Rheumatoid arthritis (HCC) 01/27/2017   Rheumatoid arthritis (HCC)    Spinal stenosis    Patient Active Problem List   Diagnosis Date Noted   Cervical spinal stenosis 02/04/2017   Home Medication(s) Prior to Admission medications   Medication Sig Start Date End Date Taking? Authorizing Provider  acetaminophen  (TYLENOL ) 500 MG tablet Take 2 tablets (1,000 mg total) by mouth every 8 (eight) hours. 11/22/23 12/22/23 Yes Govani Radloff, MD  naproxen  (NAPROSYN ) 375 MG tablet Take 1 tablet (375 mg total) by mouth 2 (two) times daily. 11/22/23  Yes Elexa Kivi, MD  oxyCODONE  (ROXICODONE ) 5 MG immediate release tablet Take 1 tablet (5 mg total) by mouth every 6 (six) hours as needed for  breakthrough pain. 11/22/23  Yes Trinidee Schrag, MD  albuterol  (VENTOLIN  HFA) 108 (90 Base) MCG/ACT inhaler Inhale 2 puffs into the lungs every 4 (four) hours as needed for wheezing or shortness of breath. 06/13/23   Cleotilde Rogue, MD  cyclobenzaprine  (FLEXERIL ) 10 MG tablet Take 1 tablet (10 mg total) by mouth 2 (two) times daily as needed for muscle spasms. Patient not taking: Reported on 01/26/2017 01/10/17   Robinson, Jordan N, PA-C  diazepam  (VALIUM ) 5 MG tablet Take 1 tablet (5 mg total) by mouth every 6 (six) hours as needed for muscle spasms. Patient not taking: Reported on 08/16/2017 02/05/17   Louis Shove, MD  levofloxacin  (LEVAQUIN ) 750 MG tablet Take 1 tablet (750 mg total) by mouth daily. 06/13/23   Cleotilde Rogue, MD  methocarbamol  (ROBAXIN ) 500 MG tablet Take 1 tablet (500 mg total) by mouth 2 (two) times daily as needed for muscle spasms. 06/13/23   Cleotilde Rogue, MD  Multiple Vitamins-Minerals (ONE-A-DAY MENOPAUSE FORMULA PO) Take 1 tablet by mouth daily.    [provider]  ondansetron  (ZOFRAN ) 8 MG tablet Take 1 tablet (8 mg total) by mouth every 8 (eight) hours as needed for nausea. 02/23/20   Geroldine Berg, MD  Past Surgical History Past Surgical History:  Procedure Laterality Date   ABDOMINAL SURGERY     ANTERIOR CERVICAL DECOMPRESSION/DISCECTOMY FUSION 4 LEVELS N/A 02/04/2017   Procedure: Cervical three-four, Cervicval four-five, Cervical five-six, Cervical six-seven Anterior cervical decompression/discectomy/fusion;  Surgeon: Louis Shove, MD;  Location: [facility] OR;  Service: Neurosurgery;  Laterality: N/A;   BACK SURGERY     2001-2002 micro discectomy 2003 reconsturctive back surgery.    LAPAROSCOPIC SALPINGO OOPHERECTOMY     TONSILLECTOMY     Family History Family History  Problem Relation Age of Onset   Arthritis Mother     Social  History Social History   Tobacco Use   Smoking status: Every Day    Current packs/day: 1.00    Average packs/day: 1 pack/day for 25.0 years (25.0 ttl pk-yrs)    Types: Cigarettes   Smokeless tobacco: Never  Vaping Use   Vaping status: Never Used  Substance Use Topics   Alcohol use: Not Currently    Comment: occasional   Drug use: No   Allergies Ambien [zolpidem], Benadryl  [diphenhydramine  hcl (sleep)], Elemental sulfur, Neurontin [gabapentin], Nyquil multi-symptom [pseudoeph-doxylamine-dm-apap], Paxil [paroxetine hcl], Penicillins, and Duragesic -100 [fentanyl ]  Review of Systems Review of Systems  Musculoskeletal:  Positive for arthralgias.  Neurological:  Positive for weakness.    Physical Exam Vital Signs  I have reviewed the triage vital signs BP 131/70   Pulse 81   Temp 98.4 F (36.9 C) (Oral)   Resp 16   Ht 5' 1 (1.549 m)   Wt 56.7 kg   LMP 05/18/2014 (Approximate)   SpO2 97%   BMI 23.62 kg/m   Physical Exam Vitals and nursing note reviewed.  Constitutional:      General: She is not in acute distress.    Appearance: She is well-developed.  HENT:     Head: Normocephalic and atraumatic.  Eyes:     Conjunctiva/sclera: Conjunctivae normal.  Cardiovascular:     Rate and Rhythm: Normal rate and regular rhythm.     Heart sounds: No murmur heard. Pulmonary:     Effort: Pulmonary effort is normal. No respiratory distress.     Breath sounds: Normal breath sounds.  Abdominal:     Palpations: Abdomen is soft.     Tenderness: There is no abdominal tenderness.  Musculoskeletal:        General: Tenderness present. No swelling.     Cervical back: Neck supple.  Skin:    General: Skin is warm and dry.     Capillary Refill: Capillary refill takes less than 2 seconds.  Neurological:     Mental Status: She is alert.     Cranial Nerves: No cranial nerve deficit.     Sensory: No sensory deficit.     Motor: No weakness.  Psychiatric:        Mood and Affect: Mood  normal.     ED Results and Treatments Labs (all labs ordered are listed, but only abnormal results are displayed) Labs Reviewed - No data to display  Radiology CT Cervical Spine Wo Contrast Result Date: 11/22/2023 CLINICAL DATA:  Cervical radiculopathy, no red flags EXAM: CT CERVICAL SPINE WITHOUT CONTRAST TECHNIQUE: Multidetector CT imaging of the cervical spine was performed without intravenous contrast. Multiplanar CT image reconstructions were also generated. RADIATION DOSE REDUCTION: This exam was performed according to the departmental dose-optimization program which includes automated exposure control, adjustment of the mA and/or kV according to patient size and/or use of iterative reconstruction technique. COMPARISON:  MRI cervical spine June 09 2017. FINDINGS: Alignment: No substantial sagittal subluxation. Skull base and vertebrae: C3-C7 ACDF. No evidence of bony fusion. No evidence of acute fracture. No evidence of hardware fracture or loosening Soft tissues and spinal canal: No prevertebral fluid or swelling. No visible canal hematoma. Disc levels: No evidence of high-grade canal or foraminal stenosis by CT. Upper chest: Emphysema.  Lung apices are clear. IMPRESSION: 1. C3-C7 ACDF without evidence of bony fusion. No evidence of hardware fracture or loosening. 2. No evidence of high-grade canal or foraminal stenosis by CT. An MRI could better assess the canal and foramina if clinically warranted. 3. No acute fracture or malalignment. Electronically Signed   By: Gilmore GORMAN Molt M.D.   On: 11/22/2023 18:40   DG Shoulder Right Result Date: 11/22/2023 CLINICAL DATA:  Right shoulder pain for 1 week, worsening after heavy lifting 3 days ago. EXAM: RIGHT SHOULDER - 2+ VIEW COMPARISON:  None Available. FINDINGS: The bones appear adequately mineralized. No evidence of acute  fracture, dislocation or osteonecrosis. There is soft tissue calcification adjacent to the greater tuberosity, suspicious for calcific tendinitis. No significant glenohumeral joint space narrowing. There are mild-to-moderate acromioclavicular degenerative changes. Postsurgical changes from lower cervical fusion are noted. IMPRESSION: 1. No acute osseous findings. 2. Soft tissue calcification adjacent to the greater tuberosity suspicious for calcific tendinitis. Electronically Signed   By: Elsie Perone M.D.   On: 11/22/2023 16:55    Pertinent labs & imaging results that were available during my care of the patient were reviewed by me and considered in my medical decision making (see MDM for details).  Medications Ordered in ED Medications  lidocaine  (LIDODERM ) 5 % 1 patch (1 patch Transdermal Patch Applied 11/22/23 1711)  ketorolac  (TORADOL ) 15 MG/ML injection 15 mg (15 mg Intramuscular Given 11/22/23 1712)  HYDROcodone -acetaminophen  (NORCO/VICODIN) 5-325 MG per tablet 1 tablet (1 tablet Oral Given 11/22/23 1809)                                                                                                                                     Procedures Procedures  (including critical care time)  Medical Decision Making / ED Course   This patient presents to the ED for concern of shoulder pain, this involves an extensive number of treatment options, and is a complaint that carries with it a high risk of complications and morbidity.  The differential diagnosis includes rotator cuff injury, tendinitis, fracture, dislocation, radiculopathy  MDM:  Patient seen emerged from for evaluation of shoulder pain.  Physical exam with significant tenderness over multiple areas around the rotator cuff on the right.  No significant tenderness over the neck.  X-ray showing calcific tendinitis but is otherwise unremarkable.  Patient pain controlled with NSAIDs and Lidoderm  but on reevaluation she has not had any  significant improvement.  Given her history stating that the last time she required neurosurgery in her neck this was a very similar presentation to today I did pursue a CT of the C-spine that was reassuringly negative for acute central cervical stenosis.  Sling placed for comfort and patient ultimately pain controlled with Norco.  I placed an outpatient referral to orthopedics as I do think patient may ultimately require an MRI of her shoulder.  At this time however she does not meet inpatient criteria for admission will be discharged with outpatient follow-up.  Return precautions given which she voiced understanding   Additional history obtained:  -External records from outside source obtained and reviewed including: Chart review including previous notes, labs, imaging, consultation notes   Imaging Studies ordered: I ordered imaging studies including shoulder x-ray, CT C-spine I independently visualized and interpreted imaging. I agree with the radiologist interpretation   Medicines ordered and prescription drug management: Meds ordered this encounter  Medications   ketorolac  (TORADOL ) 15 MG/ML injection 15 mg   lidocaine  (LIDODERM ) 5 % 1 patch   HYDROcodone -acetaminophen  (NORCO/VICODIN) 5-325 MG per tablet 1 tablet    Refill:  0   naproxen  (NAPROSYN ) 375 MG tablet    Sig: Take 1 tablet (375 mg total) by mouth 2 (two) times daily.    Dispense:  20 tablet    Refill:  0   acetaminophen  (TYLENOL ) 500 MG tablet    Sig: Take 2 tablets (1,000 mg total) by mouth every 8 (eight) hours.    Dispense:  180 tablet    Refill:  0   oxyCODONE  (ROXICODONE ) 5 MG immediate release tablet    Sig: Take 1 tablet (5 mg total) by mouth every 6 (six) hours as needed for breakthrough pain.    Dispense:  10 tablet    Refill:  0    -I have reviewed the patients home medicines and have made adjustments as needed  Critical interventions none  Social Determinants of Health:  Factors impacting patients  care include: none   Reevaluation: After the interventions noted above, I reevaluated the patient and found that they have :improved  Co morbidities that complicate the patient evaluation  Past Medical History:  Diagnosis Date   Arthritis    DDD (degenerative disc disease), cervical    Heart murmur 01/27/2017   pt has been told that she has a murmur or flutter all her life, asymptomatic   Rheumatoid arthritis (HCC) 01/27/2017   Rheumatoid arthritis (HCC)    Spinal stenosis       Dispostion: I considered admission for this patient, but at this time she does not meet inpatient criteria for admission will be discharged with outpatient follow-up.     Final Clinical Impression(s) / ED Diagnoses Final diagnoses:  Acute pain of right shoulder     @PCDICTATION @    Albertina Dixon, MD 11/22/23 2217
